# Patient Record
Sex: Female | Born: 1960 | Race: Black or African American | Hispanic: No | Marital: Married | Smoking: Never smoker
Health system: Southern US, Community
[De-identification: ages and names within clinical notes are randomized; demographics above are authoritative.]

## PROBLEM LIST (undated history)

## (undated) DIAGNOSIS — R200 Anesthesia of skin: Secondary | ICD-10-CM

## (undated) DIAGNOSIS — R202 Paresthesia of skin: Secondary | ICD-10-CM

## (undated) DIAGNOSIS — G4733 Obstructive sleep apnea (adult) (pediatric): Secondary | ICD-10-CM

## (undated) DIAGNOSIS — I10 Essential (primary) hypertension: Secondary | ICD-10-CM

## (undated) DIAGNOSIS — J45909 Unspecified asthma, uncomplicated: Secondary | ICD-10-CM

## (undated) DIAGNOSIS — K219 Gastro-esophageal reflux disease without esophagitis: Secondary | ICD-10-CM

## (undated) HISTORY — PX: COLONOSCOPY: SHX174

## (undated) HISTORY — PX: ROTATOR CUFF REPAIR: SHX139

---

## 1994-10-31 HISTORY — PX: KNEE ARTHROSCOPY: SUR90

## 1998-10-31 HISTORY — PX: LAPAROSCOPIC CHOLECYSTECTOMY: SUR755

## 2002-02-13 ENCOUNTER — Observation Stay (HOSPITAL_COMMUNITY): Admission: EM | Admit: 2002-02-13 | Discharge: 2002-02-13 | Payer: Self-pay | Admitting: Emergency Medicine

## 2002-02-13 ENCOUNTER — Encounter: Payer: Self-pay | Admitting: Emergency Medicine

## 2002-02-24 ENCOUNTER — Emergency Department (HOSPITAL_COMMUNITY): Admission: EM | Admit: 2002-02-24 | Discharge: 2002-02-24 | Payer: Self-pay | Admitting: *Deleted

## 2002-11-28 ENCOUNTER — Emergency Department (HOSPITAL_COMMUNITY): Admission: EM | Admit: 2002-11-28 | Discharge: 2002-11-28 | Payer: Self-pay | Admitting: Emergency Medicine

## 2002-11-28 ENCOUNTER — Encounter: Payer: Self-pay | Admitting: Emergency Medicine

## 2015-11-01 HISTORY — PX: ABDOMINAL HYSTERECTOMY: SHX81

## 2016-12-27 DIAGNOSIS — M7521 Bicipital tendinitis, right shoulder: Secondary | ICD-10-CM | POA: Insufficient documentation

## 2017-08-27 ENCOUNTER — Ambulatory Visit: Payer: Self-pay | Admitting: Orthopedic Surgery

## 2017-09-06 NOTE — Patient Instructions (Signed)
TAMBERLY POMPLUN  09/06/2017   Your procedure is scheduled on: Monday, Nov. 19, 2018   Report to [facility] Main  Entrance    Report to admitting at 11:15 AM   Call this number if you have problems the morning of surgery  [phone]   Remember: ONLY 1 PERSON MAY GO WITH YOU TO SHORT STAY TO GET  READY MORNING OF YOUR SURGERY.   Do not eat food or drink liquids :After Midnight.    Take these medicines the morning of surgery with A SIP OF WATER: Gabapentin                               You may not have any metal on your body including hair pins, jewelry, and piercings             Do not wear make-up, lotions, powders or perfumes, deodorant             Do not wear nail polish.  Do not shave  48 hours prior to surgery.              Do not bring valuables to the hospital. [facility] IS NOT             RESPONSIBLE   FOR VALUABLES.   Contacts, dentures or bridgework may not be worn into surgery.   Leave suitcase in the car. After surgery it may be brought to your room.              Please read over the following fact sheets you were given: _____________________________________________________________________             [facility] - Preparing for Surgery Before surgery, you can play an important role.  Because skin is not sterile, your skin needs to be as free of germs as possible.  You can reduce the number of germs on your skin by washing with CHG (chlorahexidine gluconate) soap before surgery.  CHG is an antiseptic cleaner which kills germs and bonds with the skin to continue killing germs even after washing. Please DO NOT use if you have an allergy to CHG or antibacterial soaps.  If your skin becomes reddened/irritated stop using the CHG and inform your nurse when you arrive at Short Stay. Do not shave (including legs and underarms) for at least 48 hours prior to the first CHG shower.  You may shave your face/neck.  Please follow these instructions  carefully:  1.  Shower with CHG Soap the night before surgery and the  morning of surgery.  2.  If you choose to wash your hair, wash your hair first as usual with your normal  shampoo.  3.  After you shampoo, rinse your hair and body thoroughly to remove the shampoo.                             4.  Use CHG as you would any other liquid soap.  You can apply chg directly to the skin and wash.  Gently with a scrungie or clean washcloth.  5.  Apply the CHG Soap to your body ONLY FROM THE NECK DOWN.   Do   not use on face/ open  Wound or open sores. Avoid contact with eyes, ears mouth and   genitals (private parts).                       Wash face,  Genitals (private parts) with your normal soap.             6.  Wash thoroughly, paying special attention to the area where your    surgery  will be performed.  7.  Thoroughly rinse your body with warm water from the neck down.  8.  DO NOT shower/wash with your normal soap after using and rinsing off the CHG Soap.                9.  Pat yourself dry with a clean towel.            10.  Wear clean pajamas.            11.  Place clean sheets on your bed the night of your first shower and do not  sleep with pets. Day of Surgery : Do not apply any lotions/deodorants the morning of surgery.  Please wear clean clothes to the hospital/surgery center.  FAILURE TO FOLLOW THESE INSTRUCTIONS MAY RESULT IN THE CANCELLATION OF YOUR SURGERY  PATIENT SIGNATURE_________________________________  NURSE SIGNATURE__________________________________  ________________________________________________________________________   Adam Phenix  An incentive spirometer is a tool that can help keep your lungs clear and active. This tool measures how well you are filling your lungs with each breath. Taking long deep breaths may help reverse or decrease the chance of developing breathing (pulmonary) problems (especially infection) following:  A  long period of time when you are unable to move or be active. BEFORE THE PROCEDURE   If the spirometer includes an indicator to show your best effort, your nurse or respiratory therapist will set it to a desired goal.  If possible, sit up straight or lean slightly forward. Try not to slouch.  Hold the incentive spirometer in an upright position. INSTRUCTIONS FOR USE  1. Sit on the edge of your bed if possible, or sit up as far as you can in bed or on a chair. 2. Hold the incentive spirometer in an upright position. 3. Breathe out normally. 4. Place the mouthpiece in your mouth and seal your lips tightly around it. 5. Breathe in slowly and as deeply as possible, raising the piston or the ball toward the top of the column. 6. Hold your breath for 3-5 seconds or for as long as possible. Allow the piston or ball to fall to the bottom of the column. 7. Remove the mouthpiece from your mouth and breathe out normally. 8. Rest for a few seconds and repeat Steps 1 through 7 at least 10 times every 1-2 hours when you are awake. Take your time and take a few normal breaths between deep breaths. 9. The spirometer may include an indicator to show your best effort. Use the indicator as a goal to work toward during each repetition. 10. After each set of 10 deep breaths, practice coughing to be sure your lungs are clear. If you have an incision (the cut made at the time of surgery), support your incision when coughing by placing a pillow or rolled up towels firmly against it. Once you are able to get out of bed, walk around indoors and cough well. You may stop using the incentive spirometer when instructed by your caregiver.  RISKS AND COMPLICATIONS  Take your time  so you do not get dizzy or light-headed.  If you are in pain, you may need to take or ask for pain medication before doing incentive spirometry. It is harder to take a deep breath if you are having pain. AFTER USE  Rest and breathe slowly and  easily.  It can be helpful to keep track of a log of your progress. Your caregiver can provide you with a simple table to help with this. If you are using the spirometer at home, follow these instructions: [place] IF:   You are having difficultly using the spirometer.  You have trouble using the spirometer as often as instructed.  Your pain medication is not giving enough relief while using the spirometer.  You develop fever of 100.5 F (38.1 C) or higher. SEEK IMMEDIATE MEDICAL CARE IF:   You cough up bloody sputum that had not been present before.  You develop fever of 102 F (38.9 C) or greater.  You develop worsening pain at or near the incision site. MAKE SURE YOU:   Understand these instructions.  Will watch your condition.  Will get help right away if you are not doing well or get worse. Document Released: 02/27/2007 Document Revised: 01/09/2012 Document Reviewed: 04/30/2007 ExitCare Patient Information 2014 [employer], [place].   ________________________________________________________________________  WHAT IS A BLOOD TRANSFUSION? Blood Transfusion Information  A transfusion is the replacement of blood or some of its parts. Blood is made up of multiple cells which provide different functions.  Red blood cells carry oxygen and are used for blood loss replacement.  White blood cells fight against infection.  Platelets control bleeding.  Plasma helps clot blood.  Other blood products are available for specialized needs, such as hemophilia or other clotting disorders. BEFORE THE TRANSFUSION  Who gives blood for transfusions?   Healthy volunteers who are fully evaluated to make sure their blood is safe. This is blood bank blood. Transfusion therapy is the safest it has ever been in the practice of medicine. Before blood is taken from a donor, a complete history is taken to make sure that person has no history of diseases nor engages in risky social  behavior (examples are intravenous drug use or sexual activity with multiple partners). The donor's travel history is screened to minimize risk of transmitting infections, such as malaria. The donated blood is tested for signs of infectious diseases, such as HIV and hepatitis. The blood is then tested to be sure it is compatible with you in order to minimize the chance of a transfusion reaction. If you or a relative donates blood, this is often done in anticipation of surgery and is not appropriate for emergency situations. It takes many days to process the donated blood. RISKS AND COMPLICATIONS Although transfusion therapy is very safe and saves many lives, the main dangers of transfusion include:   Getting an infectious disease.  Developing a transfusion reaction. This is an allergic reaction to something in the blood you were given. Every precaution is taken to prevent this. The decision to have a blood transfusion has been considered carefully by your caregiver before blood is given. Blood is not given unless the benefits outweigh the risks. AFTER THE TRANSFUSION  Right after receiving a blood transfusion, you will usually feel much better and more energetic. This is especially true if your red blood cells have gotten low (anemic). The transfusion raises the level of the red blood cells which carry oxygen, and this usually causes an energy increase.  The  nurse administering the transfusion will monitor you carefully for complications. HOME CARE INSTRUCTIONS  No special instructions are needed after a transfusion. You may find your energy is better. Speak with your caregiver about any limitations on activity for underlying diseases you may have. SEEK MEDICAL CARE IF:   Your condition is not improving after your transfusion.  You develop redness or irritation at the intravenous (IV) site. SEEK IMMEDIATE MEDICAL CARE IF:  Any of the following symptoms occur over the next 12 hours:  Shaking  chills.  You have a temperature by mouth above 102 F (38.9 C), not controlled by medicine.  Chest, back, or muscle pain.  People around you feel you are not acting correctly or are confused.  Shortness of breath or difficulty breathing.  Dizziness and fainting.  You get a rash or develop hives.  You have a decrease in urine output.  Your urine turns a dark color or changes to pink, red, or brown. Any of the following symptoms occur over the next 10 days:  You have a temperature by mouth above 102 F (38.9 C), not controlled by medicine.  Shortness of breath.  Weakness after normal activity.  The white part of the eye turns yellow (jaundice).  You have a decrease in the amount of urine or are urinating less often.  Your urine turns a dark color or changes to pink, red, or brown. Document Released: 10/14/2000 Document Revised: 01/09/2012 Document Reviewed: 06/02/2008 [facility] Patient Information 2014 [place], [place].  _______________________________________________________________________

## 2017-09-11 ENCOUNTER — Encounter (HOSPITAL_COMMUNITY)
Admission: RE | Admit: 2017-09-11 | Discharge: 2017-09-11 | Disposition: A | Discharge status: Home / Self Care | Payer: PRIVATE HEALTH INSURANCE | Source: Ambulatory Visit | Attending: Orthopedic Surgery | Admitting: Orthopedic Surgery

## 2017-09-11 ENCOUNTER — Encounter (HOSPITAL_COMMUNITY): Payer: Self-pay

## 2017-09-11 ENCOUNTER — Other Ambulatory Visit: Payer: Self-pay

## 2017-09-11 DIAGNOSIS — M1712 Unilateral primary osteoarthritis, left knee: Secondary | ICD-10-CM | POA: Diagnosis not present

## 2017-09-11 DIAGNOSIS — Z01818 Encounter for other preprocedural examination: Secondary | ICD-10-CM | POA: Insufficient documentation

## 2017-09-11 DIAGNOSIS — R9431 Abnormal electrocardiogram [ECG] [EKG]: Secondary | ICD-10-CM | POA: Insufficient documentation

## 2017-09-11 HISTORY — DX: Essential (primary) hypertension: I10

## 2017-09-11 HISTORY — DX: Gastro-esophageal reflux disease without esophagitis: K21.9

## 2017-09-11 LAB — COMPREHENSIVE METABOLIC PANEL
ALBUMIN: 4.1 g/dL (ref 3.5–5.0)
ALK PHOS: 57 U/L (ref 38–126)
ALT: 21 U/L (ref 14–54)
AST: 23 U/L (ref 15–41)
Anion gap: 5 (ref 5–15)
BILIRUBIN TOTAL: 0.9 mg/dL (ref 0.3–1.2)
BUN: 13 mg/dL (ref 6–20)
CALCIUM: 9.5 mg/dL (ref 8.9–10.3)
CO2: 30 mmol/L (ref 22–32)
Chloride: 106 mmol/L (ref 101–111)
Creatinine, Ser: 1.04 mg/dL — ABNORMAL HIGH (ref 0.44–1.00)
GFR calc Af Amer: >60 mL/min (ref >60)
GFR calc non Af Amer: 58 mL/min — ABNORMAL LOW (ref >60)
GLUCOSE: 120 mg/dL — AB (ref 65–99)
Potassium: 3.9 mmol/L (ref 3.5–5.1)
Sodium: 141 mmol/L (ref 135–145)
TOTAL PROTEIN: 7.6 g/dL (ref 6.5–8.1)

## 2017-09-11 LAB — SURGICAL PCR SCREEN
MRSA, PCR: NEGATIVE
STAPHYLOCOCCUS AUREUS: NEGATIVE

## 2017-09-11 LAB — CBC
HEMATOCRIT: 38.1 % (ref 36.0–46.0)
HEMOGLOBIN: 12.7 g/dL (ref 12.0–15.0)
MCH: 29.4 pg (ref 26.0–34.0)
MCHC: 33.3 g/dL (ref 30.0–36.0)
MCV: 88.2 fL (ref 78.0–100.0)
Platelets: 201 10*3/uL (ref 150–400)
RBC: 4.32 MIL/uL (ref 3.87–5.11)
RDW: 14.7 % (ref 11.5–15.5)
WBC: 4.7 10*3/uL (ref 4.0–10.5)

## 2017-09-11 LAB — PROTIME-INR
INR: 0.98
Prothrombin Time: 12.9 seconds (ref 11.4–15.2)

## 2017-09-11 LAB — ABO/RH: ABO/RH(D): O POS

## 2017-09-11 LAB — APTT: APTT: 34 s (ref 24–36)

## 2017-09-13 ENCOUNTER — Other Ambulatory Visit (HOSPITAL_COMMUNITY): Payer: Self-pay

## 2017-09-16 ENCOUNTER — Ambulatory Visit: Payer: Self-pay | Admitting: Orthopedic Surgery
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
## 2017-09-18 ENCOUNTER — Encounter (HOSPITAL_COMMUNITY): Payer: Self-pay | Admitting: *Deleted

## 2017-09-18 ENCOUNTER — Inpatient Hospital Stay (HOSPITAL_COMMUNITY)
Admission: RE | Admit: 2017-09-18 | Discharge: 2017-09-20 | DRG: 470 | Disposition: A | Discharge status: Home / Self Care | Payer: PRIVATE HEALTH INSURANCE | Source: Ambulatory Visit | Attending: Orthopedic Surgery | Admitting: Orthopedic Surgery

## 2017-09-18 ENCOUNTER — Other Ambulatory Visit: Payer: Self-pay

## 2017-09-18 ENCOUNTER — Inpatient Hospital Stay (HOSPITAL_COMMUNITY): Payer: PRIVATE HEALTH INSURANCE | Admitting: Registered Nurse

## 2017-09-18 ENCOUNTER — Encounter (HOSPITAL_COMMUNITY)
Admission: RE | Disposition: A | Discharge status: Home / Self Care | Payer: Self-pay | Source: Ambulatory Visit | Attending: Orthopedic Surgery

## 2017-09-18 DIAGNOSIS — H409 Unspecified glaucoma: Secondary | ICD-10-CM | POA: Diagnosis present

## 2017-09-18 DIAGNOSIS — K5790 Diverticulosis of intestine, part unspecified, without perforation or abscess without bleeding: Secondary | ICD-10-CM | POA: Diagnosis present

## 2017-09-18 DIAGNOSIS — M170 Bilateral primary osteoarthritis of knee: Secondary | ICD-10-CM | POA: Diagnosis present

## 2017-09-18 DIAGNOSIS — Z87891 Personal history of nicotine dependence: Secondary | ICD-10-CM | POA: Diagnosis not present

## 2017-09-18 DIAGNOSIS — Z8249 Family history of ischemic heart disease and other diseases of the circulatory system: Secondary | ICD-10-CM | POA: Diagnosis not present

## 2017-09-18 DIAGNOSIS — M069 Rheumatoid arthritis, unspecified: Secondary | ICD-10-CM | POA: Diagnosis present

## 2017-09-18 DIAGNOSIS — Z8261 Family history of arthritis: Secondary | ICD-10-CM

## 2017-09-18 DIAGNOSIS — J45909 Unspecified asthma, uncomplicated: Secondary | ICD-10-CM | POA: Diagnosis present

## 2017-09-18 DIAGNOSIS — Z885 Allergy status to narcotic agent status: Secondary | ICD-10-CM

## 2017-09-18 DIAGNOSIS — G4730 Sleep apnea, unspecified: Secondary | ICD-10-CM | POA: Diagnosis present

## 2017-09-18 DIAGNOSIS — Z91040 Latex allergy status: Secondary | ICD-10-CM

## 2017-09-18 DIAGNOSIS — M25562 Pain in left knee: Secondary | ICD-10-CM | POA: Diagnosis present

## 2017-09-18 DIAGNOSIS — I10 Essential (primary) hypertension: Secondary | ICD-10-CM | POA: Diagnosis present

## 2017-09-18 DIAGNOSIS — M1710 Unilateral primary osteoarthritis, unspecified knee: Secondary | ICD-10-CM

## 2017-09-18 DIAGNOSIS — K219 Gastro-esophageal reflux disease without esophagitis: Secondary | ICD-10-CM | POA: Diagnosis present

## 2017-09-18 DIAGNOSIS — M179 Osteoarthritis of knee, unspecified: Secondary | ICD-10-CM | POA: Diagnosis present

## 2017-09-18 DIAGNOSIS — M79609 Pain in unspecified limb: Secondary | ICD-10-CM | POA: Diagnosis not present

## 2017-09-18 HISTORY — PX: TOTAL KNEE ARTHROPLASTY: SHX125

## 2017-09-18 LAB — TYPE AND SCREEN
ABO/RH(D): O POS
ANTIBODY SCREEN: NEGATIVE

## 2017-09-18 SURGERY — ARTHROPLASTY, KNEE, TOTAL
Anesthesia: Spinal | Site: Knee | Laterality: Left

## 2017-09-18 MED ORDER — MENTHOL 3 MG MT LOZG: 1.0000 | LOZENGE | OROMUCOSAL | Status: DC | PRN

## 2017-09-18 MED ORDER — PROPOFOL 10 MG/ML IV BOLUS
INTRAVENOUS | Status: AC
  Filled 2017-09-18: qty 20

## 2017-09-18 MED ORDER — MIDAZOLAM HCL 2 MG/2ML IJ SOLN
INTRAMUSCULAR | Status: AC
  Filled 2017-09-18: qty 2

## 2017-09-18 MED ORDER — HYDROMORPHONE HCL 2 MG PO TABS
2.0000 mg | ORAL_TABLET | ORAL | Status: DC | PRN
  Administered 2017-09-18: 2 mg via ORAL
  Filled 2017-09-18: qty 1

## 2017-09-18 MED ORDER — LACTATED RINGERS IV SOLN
INTRAVENOUS | Status: DC
  Administered 2017-09-18: 12:00:00 via INTRAVENOUS

## 2017-09-18 MED ORDER — FENTANYL CITRATE (PF) 100 MCG/2ML IJ SOLN
INTRAMUSCULAR | Status: AC
  Filled 2017-09-18: qty 2

## 2017-09-18 MED ORDER — METOCLOPRAMIDE HCL 5 MG PO TABS: 5.0000 mg | ORAL_TABLET | Freq: Three times a day (TID) | ORAL | Status: DC | PRN

## 2017-09-18 MED ORDER — EPHEDRINE 5 MG/ML INJ
INTRAVENOUS | Status: AC
  Filled 2017-09-18: qty 10

## 2017-09-18 MED ORDER — ACETAMINOPHEN 10 MG/ML IV SOLN
1000.0000 mg | Freq: Once | INTRAVENOUS | Status: AC
  Administered 2017-09-18: 1000 mg via INTRAVENOUS

## 2017-09-18 MED ORDER — LIDOCAINE 2% (20 MG/ML) 5 ML SYRINGE
INTRAMUSCULAR | Status: DC | PRN
  Administered 2017-09-18: 100 mg via INTRAVENOUS

## 2017-09-18 MED ORDER — METOCLOPRAMIDE HCL 5 MG/ML IJ SOLN
5.0000 mg | Freq: Three times a day (TID) | INTRAMUSCULAR | Status: DC | PRN
  Administered 2017-09-19: 13:00:00 10 mg via INTRAVENOUS
  Filled 2017-09-18: qty 2

## 2017-09-18 MED ORDER — SODIUM CHLORIDE 0.9 % IR SOLN
Status: DC | PRN
  Administered 2017-09-18: 1000 mL

## 2017-09-18 MED ORDER — SODIUM CHLORIDE 0.9 % IV SOLN
1000.0000 mg | Freq: Once | INTRAVENOUS | Status: AC
  Administered 2017-09-18: 1000 mg via INTRAVENOUS
  Filled 2017-09-18: qty 1100

## 2017-09-18 MED ORDER — FLEET ENEMA 7-19 GM/118ML RE ENEM: 1.0000 | ENEMA | Freq: Once | RECTAL | Status: DC | PRN

## 2017-09-18 MED ORDER — ACETAMINOPHEN 500 MG PO TABS
1000.0000 mg | ORAL_TABLET | Freq: Four times a day (QID) | ORAL | Status: AC
  Administered 2017-09-18 – 2017-09-20 (×4): 1000 mg via ORAL
  Filled 2017-09-18 (×4): qty 2

## 2017-09-18 MED ORDER — BUPIVACAINE LIPOSOME 1.3 % IJ SUSP
20.0000 mL | Freq: Once | INTRAMUSCULAR | Status: DC
  Filled 2017-09-18: qty 20

## 2017-09-18 MED ORDER — PROPOFOL 10 MG/ML IV BOLUS
INTRAVENOUS | Status: AC
  Filled 2017-09-18: qty 40

## 2017-09-18 MED ORDER — MIDAZOLAM HCL 2 MG/2ML IJ SOLN
1.0000 mg | INTRAMUSCULAR | Status: DC | PRN
  Administered 2017-09-18: 2 mg via INTRAVENOUS

## 2017-09-18 MED ORDER — STERILE WATER FOR IRRIGATION IR SOLN
Status: DC | PRN
  Administered 2017-09-18: 2000 mL

## 2017-09-18 MED ORDER — TRAMADOL HCL 50 MG PO TABS: 50.0000 mg | ORAL_TABLET | Freq: Four times a day (QID) | ORAL | Status: DC | PRN

## 2017-09-18 MED ORDER — TRANEXAMIC ACID 1000 MG/10ML IV SOLN
1000.0000 mg | INTRAVENOUS | Status: AC
  Administered 2017-09-18: 1000 mg via INTRAVENOUS
  Filled 2017-09-18: qty 1100

## 2017-09-18 MED ORDER — METHOCARBAMOL 1000 MG/10ML IJ SOLN
500.0000 mg | Freq: Four times a day (QID) | INTRAMUSCULAR | Status: DC | PRN
  Administered 2017-09-18: 500 mg via INTRAVENOUS
  Filled 2017-09-18: qty 550

## 2017-09-18 MED ORDER — DEXAMETHASONE SODIUM PHOSPHATE 10 MG/ML IJ SOLN
INTRAMUSCULAR | Status: AC
  Filled 2017-09-18: qty 1

## 2017-09-18 MED ORDER — POLYETHYLENE GLYCOL 3350 17 G PO PACK: 17.0000 g | PACK | Freq: Every day | ORAL | Status: DC | PRN

## 2017-09-18 MED ORDER — CEFAZOLIN SODIUM-DEXTROSE 2-4 GM/100ML-% IV SOLN
2.0000 g | Freq: Four times a day (QID) | INTRAVENOUS | Status: AC
  Administered 2017-09-18 – 2017-09-19 (×2): 2 g via INTRAVENOUS
  Filled 2017-09-18 (×2): qty 100

## 2017-09-18 MED ORDER — GABAPENTIN 300 MG PO CAPS: 300.0000 mg | ORAL_CAPSULE | Freq: Once | ORAL | Status: DC

## 2017-09-18 MED ORDER — BUPIVACAINE LIPOSOME 1.3 % IJ SUSP
INTRAMUSCULAR | Status: DC | PRN
  Administered 2017-09-18: 20 mL

## 2017-09-18 MED ORDER — ACETAMINOPHEN 10 MG/ML IV SOLN
INTRAVENOUS | Status: AC
  Filled 2017-09-18: qty 100

## 2017-09-18 MED ORDER — SODIUM CHLORIDE 0.9 % IJ SOLN
INTRAMUSCULAR | Status: AC
  Filled 2017-09-18: qty 10

## 2017-09-18 MED ORDER — CEFAZOLIN SODIUM-DEXTROSE 2-4 GM/100ML-% IV SOLN
INTRAVENOUS | Status: AC
  Filled 2017-09-18: qty 100

## 2017-09-18 MED ORDER — FUROSEMIDE 20 MG PO TABS: 20.0000 mg | ORAL_TABLET | Freq: Every day | ORAL | Status: DC | PRN

## 2017-09-18 MED ORDER — ROPIVACAINE HCL 7.5 MG/ML IJ SOLN
INTRAMUSCULAR | Status: DC | PRN
  Administered 2017-09-18: 20 mL via PERINEURAL

## 2017-09-18 MED ORDER — METHOCARBAMOL 500 MG PO TABS
500.0000 mg | ORAL_TABLET | Freq: Four times a day (QID) | ORAL | Status: DC | PRN
  Administered 2017-09-18 – 2017-09-20 (×5): 500 mg via ORAL
  Filled 2017-09-18 (×6): qty 1

## 2017-09-18 MED ORDER — BISACODYL 10 MG RE SUPP: 10.0000 mg | Freq: Every day | RECTAL | Status: DC | PRN

## 2017-09-18 MED ORDER — GABAPENTIN 100 MG PO CAPS: 100.0000 mg | ORAL_CAPSULE | Freq: Two times a day (BID) | ORAL | Status: DC | PRN

## 2017-09-18 MED ORDER — ONDANSETRON HCL 4 MG/2ML IJ SOLN
INTRAMUSCULAR | Status: DC | PRN
  Administered 2017-09-18: 4 mg via INTRAVENOUS

## 2017-09-18 MED ORDER — LIDOCAINE 2% (20 MG/ML) 5 ML SYRINGE
INTRAMUSCULAR | Status: AC
  Filled 2017-09-18: qty 5

## 2017-09-18 MED ORDER — DEXAMETHASONE SODIUM PHOSPHATE 10 MG/ML IJ SOLN
10.0000 mg | Freq: Once | INTRAMUSCULAR | Status: AC
  Administered 2017-09-18: 10 mg via INTRAVENOUS

## 2017-09-18 MED ORDER — DOCUSATE SODIUM 100 MG PO CAPS
100.0000 mg | ORAL_CAPSULE | Freq: Two times a day (BID) | ORAL | Status: DC
  Administered 2017-09-18 – 2017-09-20 (×4): 100 mg via ORAL
  Filled 2017-09-18 (×4): qty 1

## 2017-09-18 MED ORDER — PROPOFOL 500 MG/50ML IV EMUL
INTRAVENOUS | Status: DC | PRN
  Administered 2017-09-18: 40 ug/kg/min via INTRAVENOUS

## 2017-09-18 MED ORDER — EPHEDRINE SULFATE-NACL 50-0.9 MG/10ML-% IV SOSY
PREFILLED_SYRINGE | INTRAVENOUS | Status: DC | PRN
  Administered 2017-09-18 (×2): 5 mg via INTRAVENOUS

## 2017-09-18 MED ORDER — PHENOL 1.4 % MT LIQD
1.0000 | OROMUCOSAL | Status: DC | PRN
  Filled 2017-09-18: qty 177

## 2017-09-18 MED ORDER — FENTANYL CITRATE (PF) 100 MCG/2ML IJ SOLN
50.0000 ug | INTRAMUSCULAR | Status: AC | PRN
Start: 2017-09-18 — End: 2017-09-18
  Administered 2017-09-18: 50 ug via INTRAVENOUS
  Administered 2017-09-18: 100 ug via INTRAVENOUS
  Administered 2017-09-18: 25 ug via INTRAVENOUS

## 2017-09-18 MED ORDER — ACETAMINOPHEN 650 MG RE SUPP: 650.0000 mg | RECTAL | Status: DC | PRN

## 2017-09-18 MED ORDER — HYDROMORPHONE HCL 1 MG/ML IJ SOLN
0.5000 mg | INTRAMUSCULAR | Status: DC | PRN
  Administered 2017-09-18 – 2017-09-19 (×2): 0.5 mg via INTRAVENOUS
  Filled 2017-09-18 (×2): qty 0.5

## 2017-09-18 MED ORDER — CEFAZOLIN SODIUM-DEXTROSE 2-4 GM/100ML-% IV SOLN
2.0000 g | INTRAVENOUS | Status: AC
  Administered 2017-09-18: 2 g via INTRAVENOUS

## 2017-09-18 MED ORDER — HYDROMORPHONE HCL 2 MG PO TABS
4.0000 mg | ORAL_TABLET | ORAL | Status: DC | PRN
  Administered 2017-09-19 (×2): 4 mg via ORAL
  Filled 2017-09-18 (×2): qty 2

## 2017-09-18 MED ORDER — SODIUM CHLORIDE 0.9 % IV SOLN
INTRAVENOUS | Status: DC
  Administered 2017-09-18: 18:00:00 via INTRAVENOUS

## 2017-09-18 MED ORDER — CHLORHEXIDINE GLUCONATE 4 % EX LIQD: 60.0000 mL | Freq: Once | CUTANEOUS | Status: DC

## 2017-09-18 MED ORDER — ONDANSETRON HCL 4 MG/2ML IJ SOLN
4.0000 mg | Freq: Four times a day (QID) | INTRAMUSCULAR | Status: DC | PRN
  Administered 2017-09-19: 10:00:00 4 mg via INTRAVENOUS
  Filled 2017-09-18: qty 2

## 2017-09-18 MED ORDER — GABAPENTIN 300 MG PO CAPS
ORAL_CAPSULE | ORAL | Status: AC
  Filled 2017-09-18: qty 1

## 2017-09-18 MED ORDER — ACETAMINOPHEN 325 MG PO TABS: 650.0000 mg | ORAL_TABLET | ORAL | Status: DC | PRN

## 2017-09-18 MED ORDER — SODIUM CHLORIDE 0.9 % IJ SOLN
INTRAMUSCULAR | Status: DC | PRN
  Administered 2017-09-18: 60 mL

## 2017-09-18 MED ORDER — RIVAROXABAN 10 MG PO TABS
10.0000 mg | ORAL_TABLET | Freq: Every day | ORAL | Status: DC
  Administered 2017-09-19 – 2017-09-20 (×2): 10 mg via ORAL
  Filled 2017-09-18 (×2): qty 1

## 2017-09-18 MED ORDER — ONDANSETRON HCL 4 MG PO TABS: 4.0000 mg | ORAL_TABLET | Freq: Four times a day (QID) | ORAL | Status: DC | PRN

## 2017-09-18 MED ORDER — ONDANSETRON HCL 4 MG/2ML IJ SOLN
INTRAMUSCULAR | Status: AC
  Filled 2017-09-18: qty 2

## 2017-09-18 MED ORDER — BUPIVACAINE IN DEXTROSE 0.75-8.25 % IT SOLN
INTRATHECAL | Status: DC | PRN
  Administered 2017-09-18: 2 mL via INTRATHECAL

## 2017-09-18 MED ORDER — SODIUM CHLORIDE 0.9 % IJ SOLN
INTRAMUSCULAR | Status: AC
  Filled 2017-09-18: qty 50

## 2017-09-18 MED ORDER — DIPHENHYDRAMINE HCL 12.5 MG/5ML PO ELIX: 12.5000 mg | ORAL_SOLUTION | ORAL | Status: DC | PRN

## 2017-09-18 MED ORDER — DEXAMETHASONE SODIUM PHOSPHATE 10 MG/ML IJ SOLN
10.0000 mg | Freq: Once | INTRAMUSCULAR | Status: AC
  Administered 2017-09-19: 09:00:00 10 mg via INTRAVENOUS
  Filled 2017-09-18: qty 1

## 2017-09-18 SURGICAL SUPPLY — 49 items
BAG DECANTER FOR FLEXI CONT (MISCELLANEOUS) IMPLANT
BAG ZIPLOCK 12X15 (MISCELLANEOUS) ×3 IMPLANT
BANDAGE ACE 6X5 VEL STRL LF (GAUZE/BANDAGES/DRESSINGS) ×3 IMPLANT
BLADE SAG 18X100X1.27 (BLADE) ×3 IMPLANT
BLADE SAW SGTL 11.0X1.19X90.0M (BLADE) ×3 IMPLANT
BOWL SMART MIX CTS (DISPOSABLE) ×3 IMPLANT
CAPT KNEE TOTAL 3 ATTUNE ×3 IMPLANT
CEMENT HV SMART SET (Cement) ×6 IMPLANT
CLOSURE WOUND 1/2 X4 (GAUZE/BANDAGES/DRESSINGS) ×1
COVER SURGICAL LIGHT HANDLE (MISCELLANEOUS) ×3 IMPLANT
CUFF TOURN SGL QUICK 34 (TOURNIQUET CUFF) ×2
CUFF TRNQT CYL 34X4X40X1 (TOURNIQUET CUFF) ×1 IMPLANT
DECANTER SPIKE VIAL GLASS SM (MISCELLANEOUS) ×3 IMPLANT
DRAPE U-SHAPE 47X51 STRL (DRAPES) ×3 IMPLANT
DRSG ADAPTIC 3X8 NADH LF (GAUZE/BANDAGES/DRESSINGS) ×3 IMPLANT
DRSG PAD ABDOMINAL 8X10 ST (GAUZE/BANDAGES/DRESSINGS) ×3 IMPLANT
DURAPREP 26ML APPLICATOR (WOUND CARE) ×3 IMPLANT
ELECT REM PT RETURN 15FT ADLT (MISCELLANEOUS) ×3 IMPLANT
EVACUATOR 1/8 PVC DRAIN (DRAIN) ×3 IMPLANT
GAUZE SPONGE 4X4 12PLY STRL (GAUZE/BANDAGES/DRESSINGS) ×3 IMPLANT
GLOVE BIOGEL PI IND STRL 6.5 (GLOVE) ×2 IMPLANT
GLOVE BIOGEL PI IND STRL 7.5 (GLOVE) ×5 IMPLANT
GLOVE BIOGEL PI IND STRL 8 (GLOVE) ×3 IMPLANT
GLOVE BIOGEL PI INDICATOR 6.5 (GLOVE) ×4
GLOVE BIOGEL PI INDICATOR 7.5 (GLOVE) ×10
GLOVE BIOGEL PI INDICATOR 8 (GLOVE) ×6
GLOVE SURG SS PI 6.5 STRL IVOR (GLOVE) IMPLANT
GOWN STRL REUS W/TWL LRG LVL3 (GOWN DISPOSABLE) ×3 IMPLANT
GOWN STRL REUS W/TWL XL LVL3 (GOWN DISPOSABLE) IMPLANT
HANDPIECE INTERPULSE COAX TIP (DISPOSABLE) ×2
IMMOBILIZER KNEE 20 (SOFTGOODS) ×3
IMMOBILIZER KNEE 20 THIGH 36 (SOFTGOODS) ×1 IMPLANT
MANIFOLD NEPTUNE II (INSTRUMENTS) ×3 IMPLANT
NS IRRIG 1000ML POUR BTL (IV SOLUTION) ×3 IMPLANT
PACK TOTAL KNEE CUSTOM (KITS) ×3 IMPLANT
PADDING CAST COTTON 6X4 STRL (CAST SUPPLIES) ×3 IMPLANT
POSITIONER SURGICAL ARM (MISCELLANEOUS) ×3 IMPLANT
SET HNDPC FAN SPRY TIP SCT (DISPOSABLE) ×1 IMPLANT
STRIP CLOSURE SKIN 1/2X4 (GAUZE/BANDAGES/DRESSINGS) ×2 IMPLANT
SUT MNCRL AB 4-0 PS2 18 (SUTURE) ×3 IMPLANT
SUT STRATAFIX 0 PDS 27 VIOLET (SUTURE) ×3
SUT VIC AB 2-0 CT1 27 (SUTURE) ×6
SUT VIC AB 2-0 CT1 TAPERPNT 27 (SUTURE) ×3 IMPLANT
SUTURE STRATFX 0 PDS 27 VIOLET (SUTURE) ×1 IMPLANT
SYR 30ML LL (SYRINGE) ×6 IMPLANT
TRAY FOLEY BAG SILVER LF 16FR (CATHETERS) ×3 IMPLANT
WATER STERILE IRR 1000ML POUR (IV SOLUTION) ×6 IMPLANT
WRAP KNEE MAXI GEL POST OP (GAUZE/BANDAGES/DRESSINGS) ×3 IMPLANT
YANKAUER SUCT BULB TIP 10FT TU (MISCELLANEOUS) ×3 IMPLANT

## 2017-09-18 NOTE — Anesthesia Procedure Notes (Signed)
Spinal  Start time: 09/18/2017 1:20 PM End time: 09/18/2017 1:28 PM Staffing Resident/CRNA: Victoriano Lain, CRNA Preanesthetic Checklist Completed: patient identified, site marked, surgical consent, pre-op evaluation, timeout performed, IV checked, risks and benefits discussed and monitors and equipment checked Spinal Block Patient position: sitting Prep: DuraPrep Patient monitoring: heart rate, continuous pulse ox and blood pressure Approach: right paramedian Location: L3-4 Needle Needle type: Pencan  Needle gauge: 24 G Needle length: 10 cm Assessment Sensory level: T4 Additional Notes Spinal kit expiration date checked and verified. Two attempts by CRNA. - heme, +CSF. Pt tolerated well.

## 2017-09-18 NOTE — Transfer of Care (Signed)
Immediate Anesthesia Transfer of Care Note  Patient: Cynthia Dickson  Procedure(s) Performed: LEFT TOTAL KNEE ARTHROPLASTY (Left Knee)  Patient Location: PACU  Anesthesia Type:Spinal  Level of Consciousness: awake, alert , oriented and patient cooperative  Airway & Oxygen Therapy: Patient Spontanous Breathing and Patient connected to face mask oxygen  Post-op Assessment: Report given to RN and Post -op Vital signs reviewed and stable  Post vital signs: Reviewed and stable  Last Vitals:  Vitals:   09/18/17 1254 09/18/17 1300  BP:    Pulse: 67 64  Resp: 15 11  Temp:    SpO2: 100% 100%    Last Pain:  Vitals:   09/18/17 1146  TempSrc: Oral  PainSc:       Patients Stated Pain Goal: 5 (09/18/17 1142)  Complications: No apparent anesthesia complications

## 2017-09-18 NOTE — Anesthesia Procedure Notes (Signed)
Anesthesia Regional Block: Adductor canal block   Pre-Anesthetic Checklist: ,, timeout performed, Correct Patient, Correct Site, Correct Laterality, Correct Procedure, Correct Position, site marked, Risks and benefits discussed, pre-op evaluation,  At surgeon's request and post-op pain management  Laterality: Left  Prep: chloraprep       Needles:   Needle Type: Echogenic Needle     Needle Length: 10cm  Needle Gauge: 21     Additional Needles:   Procedures:,,,, ultrasound used (permanent image in chart),,,,  Narrative:  Start time: 09/18/2017 12:55 PM End time: 09/18/2017 1:04 PM Injection made incrementally with aspirations every 5 mL. Anesthesiologist: Cristela Blue, MD

## 2017-09-18 NOTE — Anesthesia Preprocedure Evaluation (Signed)
Anesthesia Evaluation  Patient identified by MRN, date of birth, ID band Patient awake    Reviewed: Allergy & Precautions, H&P , Patient's Chart, lab work & pertinent test results, reviewed documented beta blocker date and time   Airway Mallampati: II  TM Distance: >3 FB Neck ROM: full    Dental no notable dental hx.    Pulmonary asthma , sleep apnea ,    Pulmonary exam normal breath sounds clear to auscultation       Cardiovascular hypertension, On Medications  Rhythm:regular Rate:Normal     Neuro/Psych    GI/Hepatic GERD  ,  Endo/Other    Renal/GU      Musculoskeletal   Abdominal   Peds  Hematology   Anesthesia Other Findings   Reproductive/Obstetrics                             Anesthesia Physical Anesthesia Plan  ASA: III  Anesthesia Plan: Spinal   Post-op Pain Management:  Regional for Post-op pain   Induction:   PONV Risk Score and Plan: 2 and Dexamethasone, Ondansetron and Treatment may vary due to age or medical condition  Airway Management Planned:   Additional Equipment:   Intra-op Plan:   Post-operative Plan:   Informed Consent: I have reviewed the patients History and Physical, chart, labs and discussed the procedure including the risks, benefits and alternatives for the proposed anesthesia with the patient or authorized representative who has indicated his/her understanding and acceptance.   Dental Advisory Given  Plan Discussed with: CRNA and Surgeon  Anesthesia Plan Comments: (  )        Anesthesia Quick Evaluation

## 2017-09-18 NOTE — Anesthesia Postprocedure Evaluation (Signed)
Anesthesia Post Note  Patient: Cynthia Dickson  Procedure(s) Performed: LEFT TOTAL KNEE ARTHROPLASTY (Left Knee)     Patient location during evaluation: PACU Anesthesia Type: Spinal Level of consciousness: awake Pain management: satisfactory to patient Vital Signs Assessment: post-procedure vital signs reviewed and stable Respiratory status: spontaneous breathing Cardiovascular status: blood pressure returned to baseline Postop Assessment: no headache and spinal receding Anesthetic complications: no    Last Vitals:  Vitals:   09/18/17 1600 09/18/17 1615  BP: (!) 141/91 (!) 143/78  Pulse: (!) 55 (!) 56  Resp: 16 12  Temp:    SpO2: 100% 100%    Last Pain:  Vitals:   09/18/17 1615  TempSrc:   PainSc: 0-No pain                 Mahari Strahm EDWARD

## 2017-09-18 NOTE — Progress Notes (Signed)
AssistedDr. Kyle Jackson with left, ultrasound guided, adductor canal block. Side rails up, monitors on throughout procedure. See vital signs in flow sheet. Tolerated Procedure well.  

## 2017-09-18 NOTE — Interval H&P Note (Signed)
History and Physical Interval Note:  09/18/2017 11:31 AM  Cynthia Dickson  has presented today for surgery, with the diagnosis of Osteoarthritis Left knee  The various methods of treatment have been discussed with the patient and family. After consideration of risks, benefits and other options for treatment, the patient has consented to  Procedure(s): LEFT TOTAL KNEE ARTHROPLASTY (Left) as a surgical intervention .  The patient's history has been reviewed, patient examined, no change in status, stable for surgery.  I have reviewed the patient's chart and labs.  Questions were answered to the patient's satisfaction.     Homero Fellers Matheau [place]

## 2017-09-18 NOTE — Op Note (Signed)
OPERATIVE REPORT-TOTAL KNEE ARTHROPLASTY   Pre-operative diagnosis- Osteoarthritis  Left knee(s)  Post-operative diagnosis- Osteoarthritis Left knee(s)  Procedure-  Left  Total Knee Arthroplasty (Depuy Attune)  Surgeon- Gus Rankin. Kalecia Hartney, MD  Assistant- Avel Peace, PA-C   Anesthesia-  Adductor canal lock and spinal  EBL- 25 ml   Drains Hemovac  Tourniquet time-  Total Tourniquet Time Documented: Thigh (Left) - 43 minutes Total: Thigh (Left) - 43 minutes     Complications- None  Condition-PACU - hemodynamically stable.   Brief Clinical Note  Cynthia Dickson is a 56 y.o. year old female with end stage OA of her left knee with progressively worsening pain and dysfunction. She has constant pain, with activity and at rest and significant functional deficits with difficulties even with ADLs. She has had extensive non-op management including analgesics, injections of cortisone, and home exercise program, but remains in significant pain with significant dysfunction. Radiographs show bone on bone arthritis medial and patellofemoral. She presents now for left Total Knee Arthroplasty.    Procedure in detail---   The patient is brought into the operating room and positioned supine on the operating table. After successful administration of  Adductor canal lock and spinal,   a tourniquet is placed high on the  Left thigh(s) and the lower extremity is prepped and draped in the usual sterile fashion. Time out is performed by the operating team and then the  Left lower extremity is wrapped in Esmarch, knee flexed and the tourniquet inflated to 300 mmHg.       A midline incision is made with a ten blade through the subcutaneous tissue to the level of the extensor mechanism. A fresh blade is used to make a medial parapatellar arthrotomy. Soft tissue over the proximal medial tibia is subperiosteally elevated to the joint line with a knife and into the semimembranosus bursa with a Cobb elevator.  Soft tissue over the proximal lateral tibia is elevated with attention being paid to avoiding the patellar tendon on the tibial tubercle. The patella is everted, knee flexed 90 degrees and the ACL and PCL are removed. Findings are bone on bone medial and patellofemoral with large global osteophytes.        The drill is used to create a starting hole in the distal femur and the canal is thoroughly irrigated with sterile saline to remove the fatty contents. The 5 degree Left  valgus alignment guide is placed into the femoral canal and the distal femoral cutting block is pinned to remove 9 mm off the distal femur. Resection is made with an oscillating saw.      The tibia is subluxed forward and the menisci are removed. The extramedullary alignment guide is placed referencing proximally at the medial aspect of the tibial tubercle and distally along the second metatarsal axis and tibial crest. The block is pinned to remove 90mm off the more deficient medial  side. Resection is made with an oscillating saw. Size 6is the most appropriate size for the tibia and the proximal tibia is prepared with the modular drill and keel punch for that size.      The femoral sizing guide is placed and size 7 is most appropriate. Rotation is marked off the epicondylar axis and confirmed by creating a rectangular flexion gap at 90 degrees. The size 7 cutting block is pinned in this rotation and the anterior, posterior and chamfer cuts are made with the oscillating saw. The intercondylar block is then placed and that cut is made.  Trial size 6 tibial component, trial size 7 posterior stabilized femur and a 6  mm posterior stabilized rotating platform insert trial is placed. Full extension is achieved with excellent varus/valgus and anterior/posterior balance throughout full range of motion. The patella is everted and thickness measured to be 22  mm. Free hand resection is taken to 12 mm, a 35 template is placed, lug holes are drilled,  trial patella is placed, and it tracks normally. Osteophytes are removed off the posterior femur with the trial in place. All trials are removed and the cut bone surfaces prepared with pulsatile lavage. Cement is mixed and once ready for implantation, the size 6 tibial implant, size  7 posterior stabilized femoral component, and the size 35 patella are cemented in place and the patella is held with the clamp. The trial insert is placed and the knee held in full extension. The Exparel (20 ml mixed with 60 ml saline) is injected into the extensor mechanism, posterior capsule, medial and lateral gutters and subcutaneous tissues.  All extruded cement is removed and once the cement is hard the permanent 6 mm posterior stabilized rotating platform insert is placed into the tibial tray.      The wound is copiously irrigated with saline solution and the extensor mechanism closed over a hemovac drain with #1 V-loc suture. The tourniquet is released for a total tourniquet time of 43  minutes. Flexion against gravity is 130 degrees and the patella tracks normally. Subcutaneous tissue is closed with 2.0 vicryl and subcuticular with running 4.0 Monocryl. The incision is cleaned and dried and steri-strips and a bulky sterile dressing are applied. The limb is placed into a knee immobilizer and the patient is awakened and transported to recovery in stable condition.      Please note that a surgical assistant was a medical necessity for this procedure in order to perform it in a safe and expeditious manner. Surgical assistant was necessary to retract the ligaments and vital neurovascular structures to prevent injury to them and also necessary for proper positioning of the limb to allow for anatomic placement of the prosthesis.   Gus Rankin Cynthia Oates, MD    09/18/2017, 2:44 PM

## 2017-09-18 NOTE — Anesthesia Procedure Notes (Signed)
Spinal  Patient location during procedure: OR Staffing Anesthesiologist: Ivania Teagarden, MD Spinal Block Patient position: sitting Prep: DuraPrep Patient monitoring: heart rate, blood pressure and continuous pulse ox Approach: right paramedian Location: L3-4 Injection technique: single-shot Needle Needle type: Sprotte  Needle gauge: 24 G Needle length: 9 cm Assessment Sensory level: T4 Additional Notes Spinal Dosage in OR  .75% Bupivicaine ml       1.9 LLD x 3 min     

## 2017-09-19 LAB — CBC
HEMATOCRIT: 36.7 % (ref 36.0–46.0)
HEMOGLOBIN: 12.2 g/dL (ref 12.0–15.0)
MCH: 29.3 pg (ref 26.0–34.0)
MCHC: 33.2 g/dL (ref 30.0–36.0)
MCV: 88.2 fL (ref 78.0–100.0)
Platelets: 205 10*3/uL (ref 150–400)
RBC: 4.16 MIL/uL (ref 3.87–5.11)
RDW: 14.5 % (ref 11.5–15.5)
WBC: 10.8 10*3/uL — ABNORMAL HIGH (ref 4.0–10.5)

## 2017-09-19 LAB — BASIC METABOLIC PANEL
Anion gap: 9 (ref 5–15)
BUN: 12 mg/dL (ref 6–20)
CALCIUM: 8.8 mg/dL — AB (ref 8.9–10.3)
CHLORIDE: 105 mmol/L (ref 101–111)
CO2: 25 mmol/L (ref 22–32)
CREATININE: 0.9 mg/dL (ref 0.44–1.00)
GFR calc Af Amer: >60 mL/min (ref >60)
GFR calc non Af Amer: >60 mL/min (ref >60)
GLUCOSE: 142 mg/dL — AB (ref 65–99)
Potassium: 4 mmol/L (ref 3.5–5.1)
Sodium: 139 mmol/L (ref 135–145)

## 2017-09-19 MED ORDER — METHOCARBAMOL 500 MG PO TABS: 500.0000 mg | ORAL_TABLET | Freq: Four times a day (QID) | ORAL | 0 refills | Status: DC | PRN

## 2017-09-19 MED ORDER — ALUM & MAG HYDROXIDE-SIMETH 200-200-20 MG/5ML PO SUSP: 30.0000 mL | ORAL | Status: DC | PRN

## 2017-09-19 MED ORDER — RIVAROXABAN 10 MG PO TABS: 10.0000 mg | ORAL_TABLET | Freq: Every day | ORAL | 0 refills | Status: DC

## 2017-09-19 MED ORDER — TRAMADOL HCL 50 MG PO TABS: 50.0000 mg | ORAL_TABLET | Freq: Four times a day (QID) | ORAL | Status: DC | PRN

## 2017-09-19 MED ORDER — HYDROMORPHONE HCL 2 MG PO TABS
2.0000 mg | ORAL_TABLET | ORAL | Status: DC | PRN
  Administered 2017-09-19 – 2017-09-20 (×3): 4 mg via ORAL
  Filled 2017-09-19 (×5): qty 2
  Filled 2017-09-19: qty 1

## 2017-09-19 MED ORDER — ALUM & MAG HYDROXIDE-SIMETH 200-200-20 MG/5ML PO SUSP
30.0000 mL | ORAL | Status: DC | PRN
  Administered 2017-09-20 (×2): 30 mL via ORAL
  Filled 2017-09-19 (×2): qty 30

## 2017-09-19 MED ORDER — HYDROMORPHONE HCL 2 MG PO TABS: 2.0000 mg | ORAL_TABLET | ORAL | 0 refills | Status: DC | PRN

## 2017-09-19 MED ORDER — TRAMADOL HCL 50 MG PO TABS: 50.0000 mg | ORAL_TABLET | Freq: Four times a day (QID) | ORAL | 0 refills | Status: DC | PRN

## 2017-09-19 MED ORDER — OMEPRAZOLE 20 MG PO CPDR
20.0000 mg | DELAYED_RELEASE_CAPSULE | Freq: Every day | ORAL | Status: DC
  Filled 2017-09-19: qty 1

## 2017-09-19 MED ORDER — ESOMEPRAZOLE MAGNESIUM 20 MG PO CPDR
20.0000 mg | DELAYED_RELEASE_CAPSULE | Freq: Every day | ORAL | Status: DC
  Administered 2017-09-19 – 2017-09-20 (×2): 20 mg via ORAL
  Filled 2017-09-19 (×2): qty 1

## 2017-09-19 MED ORDER — HYDROMORPHONE HCL 2 MG PO TABS
2.0000 mg | ORAL_TABLET | ORAL | Status: DC | PRN
  Administered 2017-09-19 – 2017-09-20 (×6): 2 mg via ORAL
  Filled 2017-09-19 (×3): qty 1

## 2017-09-19 NOTE — Discharge Summary (Signed)
Physician Discharge Summary   Patient ID: Cynthia Dickson MRN: [id number] DOB/AGE: 58-Sep-1962 65 y.o.  Admit date: 09/18/2017 Discharge date: 09/20/2017  Primary Diagnosis:  Osteoarthritis  Left knee(s)   Admission Diagnoses:  Past Medical History:  Diagnosis Date  . Asthma   . GERD (gastroesophageal reflux disease)   . Hypertension   . Sleep apnea    Discharge Diagnoses:   Principal Problem:   OA (osteoarthritis) of knee  Estimated body mass index is 37.56 kg/m as calculated from the following:   Height as of this encounter: _0  (1.727 m).   Weight as of this encounter: 112 kg (247 lb).  Procedure:  Procedure(s) (LRB): LEFT TOTAL KNEE ARTHROPLASTY (Left)   Consults: None  HPI: Cynthia Dickson is a 56 y.o. year old female with end stage OA of her left knee with progressively worsening pain and dysfunction. She has constant pain, with activity and at rest and significant functional deficits with difficulties even with ADLs. She has had extensive non-op management including analgesics, injections of cortisone, and home exercise program, but remains in significant pain with significant dysfunction. Radiographs show bone on bone arthritis medial and patellofemoral. She presents now for left Total Knee Arthroplasty.     Laboratory Data: Admission on 09/18/2017  Component Date Value Ref Range Status  . WBC 09/19/2017 10.8* 4.0 - 10.5 K/uL Final  . RBC 09/19/2017 4.16  3.87 - 5.11 MIL/uL Final  . Hemoglobin 09/19/2017 12.2  12.0 - 15.0 g/dL Final  . HCT 09/19/2017 36.7  36.0 - 46.0 % Final  . MCV 09/19/2017 88.2  78.0 - 100.0 fL Final  . MCH 09/19/2017 29.3  26.0 - 34.0 pg Final  . MCHC 09/19/2017 33.2  30.0 - 36.0 g/dL Final  . RDW 09/19/2017 14.5  11.5 - 15.5 % Final  . Platelets 09/19/2017 205  150 - 400 K/uL Final  . Sodium 09/19/2017 139  135 - 145 mmol/L Final  . Potassium 09/19/2017 4.0  3.5 - 5.1 mmol/L Final  . Chloride 09/19/2017 105  101 - 111 mmol/L Final    . CO2 09/19/2017 25  22 - 32 mmol/L Final  . Glucose, Bld 09/19/2017 142* 65 - 99 mg/dL Final  . BUN 09/19/2017 12  6 - 20 mg/dL Final  . Creatinine, Ser 09/19/2017 0.90  0.44 - 1.00 mg/dL Final  . Calcium 09/19/2017 8.8* 8.9 - 10.3 mg/dL Final  . GFR calc non Af Amer 09/19/2017 >60  >60 mL/min Final  . GFR calc Af Amer 09/19/2017 >60  >60 mL/min Final   Comment: (NOTE) The eGFR has been calculated using the CKD EPI equation. This calculation has not been validated in all clinical situations. eGFR's persistently <60 mL/min signify possible Chronic Kidney Disease.   Georgiann Hahn gap 09/19/2017 9  5 - 15 Final  Hospital Outpatient Visit on 09/11/2017  Component Date Value Ref Range Status  . aPTT 09/11/2017 34  24 - 36 seconds Final  . WBC 09/11/2017 4.7  4.0 - 10.5 K/uL Final  . RBC 09/11/2017 4.32  3.87 - 5.11 MIL/uL Final  . Hemoglobin 09/11/2017 12.7  12.0 - 15.0 g/dL Final  . HCT 09/11/2017 38.1  36.0 - 46.0 % Final  . MCV 09/11/2017 88.2  78.0 - 100.0 fL Final  . MCH 09/11/2017 29.4  26.0 - 34.0 pg Final  . MCHC 09/11/2017 33.3  30.0 - 36.0 g/dL Final  . RDW 09/11/2017 14.7  11.5 - 15.5 % Final  . Platelets 09/11/2017 201  150 - 400 K/uL Final  . Sodium 09/11/2017 141  135 - 145 mmol/L Final  . Potassium 09/11/2017 3.9  3.5 - 5.1 mmol/L Final  . Chloride 09/11/2017 106  101 - 111 mmol/L Final  . CO2 09/11/2017 30  22 - 32 mmol/L Final  . Glucose, Bld 09/11/2017 120* 65 - 99 mg/dL Final  . BUN 09/11/2017 13  6 - 20 mg/dL Final  . Creatinine, Ser 09/11/2017 1.04* 0.44 - 1.00 mg/dL Final  . Calcium 09/11/2017 9.5  8.9 - 10.3 mg/dL Final  . Total Protein 09/11/2017 7.6  6.5 - 8.1 g/dL Final  . Albumin 09/11/2017 4.1  3.5 - 5.0 g/dL Final  . AST 09/11/2017 23  15 - 41 U/L Final  . ALT 09/11/2017 21  14 - 54 U/L Final  . Alkaline Phosphatase 09/11/2017 57  38 - 126 U/L Final  . Total Bilirubin 09/11/2017 0.9  0.3 - 1.2 mg/dL Final  . GFR calc non Af Amer 09/11/2017 58* >60 mL/min  Final  . GFR calc Af Amer 09/11/2017 >60  >60 mL/min Final   Comment: (NOTE) The eGFR has been calculated using the CKD EPI equation. This calculation has not been validated in all clinical situations. eGFR's persistently <60 mL/min signify possible Chronic Kidney Disease.   . Anion gap 09/11/2017 5  5 - 15 Final  . Prothrombin Time 09/11/2017 12.9  11.4 - 15.2 seconds Final  . INR 09/11/2017 0.98   Final  . ABO/RH(D) 09/11/2017 O POS   Final  . Antibody Screen 09/11/2017 NEG   Final  . Sample Expiration 09/11/2017 09/21/2017   Final  . Extend sample reason 09/11/2017 NO TRANSFUSIONS OR PREGNANCY IN THE PAST 3 MONTHS   Final  . MRSA, PCR 09/11/2017 NEGATIVE  NEGATIVE Final  . Staphylococcus aureus 09/11/2017 NEGATIVE  NEGATIVE Final   Comment: (NOTE) The Xpert SA Assay (FDA approved for NASAL specimens in patients 50 years of age and older), is one component of a comprehensive surveillance program. It is not intended to diagnose infection nor to guide or monitor treatment.   . ABO/RH(D) 09/11/2017 O POS   Final     X-Rays:No results found.  EKG: Orders placed or performed during the hospital encounter of 09/11/17  . EKG 12 lead  . EKG 12 lead     Hospital Course: ASHELY Dickson is a 56 y.o. who was admitted to [facility]. They were brought to the operating room on 09/18/2017 and underwent Procedure(s): LEFT TOTAL KNEE ARTHROPLASTY.  Patient tolerated the procedure well and was later transferred to the recovery room and then to the orthopaedic floor for postoperative care.  They were given PO and IV analgesics for pain control following their surgery.  They were given 24 hours of postoperative antibiotics of  Anti-infectives (From admission, onward)   Start     Dose/Rate Route Frequency Ordered Stop   09/18/17 2100  ceFAZolin (ANCEF) IVPB 2g/100 mL premix     2 g 200 mL/hr over 30 Minutes Intravenous Every 6 hours 09/18/17 1728 09/19/17 0454   09/18/17 1120   ceFAZolin (ANCEF) 2-4 GM/100ML-% IVPB    Comments:  Harvell, Gwendolyn  : cabinet override      09/18/17 1120 09/18/17 1330   09/18/17 1116  ceFAZolin (ANCEF) IVPB 2g/100 mL premix     2 g 200 mL/hr over 30 Minutes Intravenous On call to O.R. 09/18/17 1116 09/18/17 1400     and started on DVT prophylaxis in the form  of Xarelto.   PT and OT were ordered for total joint protocol.  Discharge planning consulted to help with postop disposition and equipment needs.  Patient had a rough night on the evening of surgery with pain.  They started to get up OOB with therapy on day one. Hemovac drain was pulled without difficulty.  Continued to work with therapy into day two.  Dressing was changed on day two and the incision was healing well.  Her second night was better.  Rested a little more.  She had some reflux issues and has complaints of burning this morning.  GI cocktail helped that morning.  She only walked once on day one.  Patient was seen in rounds on day two, doing better and was ready to go home.  Diet - Cardiac diet Follow up - in 2 weeks Activity - WBAT Disposition - Home Condition Upon Discharge - Stable D/C Meds - See DC Summary DVT Prophylaxis - Xarelto     Discharge Instructions    Call MD / Call 911   Complete by:  As directed    If you experience chest pain or shortness of breath, CALL 911 and be transported to the hospital emergency room.  If you develope a fever above 101 F, pus (white drainage) or increased drainage or redness at the wound, or calf pain, call your surgeon's office.   Change dressing   Complete by:  As directed    Change dressing daily with sterile 4 x 4 inch gauze dressing and apply TED hose. Do not submerge the incision under water.   Constipation Prevention   Complete by:  As directed    Drink plenty of fluids.  Prune juice may be helpful.  You may use a stool softener, such as Colace (over the counter) 100 mg twice a day.  Use MiraLax (over the counter) for  constipation as needed.   Diet - low sodium heart healthy   Complete by:  As directed    Discharge instructions   Complete by:  As directed    Take Xarelto for two and a half more weeks, then discontinue Xarelto. Once the patient has completed the blood thinner regimen, then take a Baby 81 mg Aspirin daily for three more weeks.   Pick up stool softner and laxative for home use following surgery while on pain medications. Do not submerge incision under water. Please use good hand washing techniques while changing dressing each day. May shower starting three days after surgery. Please use a clean towel to pat the incision dry following showers. Continue to use ice for pain and swelling after surgery. Do not use any lotions or creams on the incision until instructed by your surgeon.  Wear both TED hose on both legs during the day every day for three weeks, but may remove the TED hose at night at home.  Postoperative Constipation Protocol  Constipation - defined medically as fewer than three stools per week and severe constipation as less than one stool per week.  One of the most common issues patients have following surgery is constipation.  Even if you have a regular bowel pattern at home, your normal regimen is likely to be disrupted due to multiple reasons following surgery.  Combination of anesthesia, postoperative narcotics, change in appetite and fluid intake all can affect your bowels.  In order to avoid complications following surgery, here are some recommendations in order to help you during your recovery period.  Colace (docusate) - Pick up an  over-the-counter form of Colace or another stool softener and take twice a day as long as you are requiring postoperative pain medications.  Take with a full glass of water daily.  If you experience loose stools or diarrhea, hold the colace until you stool forms back up.  If your symptoms do not get better within 1 week or if they get worse,  check with your doctor.  Dulcolax (bisacodyl) - Pick up over-the-counter and take as directed by the product packaging as needed to assist with the movement of your bowels.  Take with a full glass of water.  Use this product as needed if not relieved by Colace only.   MiraLax (polyethylene glycol) - Pick up over-the-counter to have on hand.  MiraLax is a solution that will increase the amount of water in your bowels to assist with bowel movements.  Take as directed and can mix with a glass of water, juice, soda, coffee, or tea.  Take if you go more than two days without a movement. Do not use MiraLax more than once per day. Call your doctor if you are still constipated or irregular after using this medication for 7 days in a row.  If you continue to have problems with postoperative constipation, please contact the office for further assistance and recommendations.  If you experience "the worst abdominal pain ever" or develop nausea or vomiting, please contact the office immediatly for further recommendations for treatment.   Do not put a pillow under the knee. Place it under the heel.   Complete by:  As directed    Do not sit on low chairs, stoools or toilet seats, as it may be difficult to get up from low surfaces   Complete by:  As directed    Driving restrictions   Complete by:  As directed    No driving until released by the physician.   Increase activity slowly as tolerated   Complete by:  As directed    Lifting restrictions   Complete by:  As directed    No lifting until released by the physician.   Patient may shower   Complete by:  As directed    You may shower without a dressing once there is no drainage.  Do not wash over the wound.  If drainage remains, do not shower until drainage stops.   TED hose   Complete by:  As directed    Use stockings (TED hose) for 3 weeks on both leg(s).  You may remove them at night for sleeping.   Weight bearing as tolerated   Complete by:  As  directed    Laterality:  left   Extremity:  Lower     Allergies as of 09/19/2017      Reactions   Codeine Shortness Of Breath, Itching   Latex Itching, Swelling   Latex balloons cause lips to swell       Medication List    STOP taking these medications   celecoxib 200 MG capsule Commonly known as:  CELEBREX   ibuprofen 200 MG tablet Commonly known as:  ADVIL,MOTRIN     TAKE these medications   benazepril-hydrochlorthiazide 20-12.5 MG tablet Commonly known as:  LOTENSIN HCT Take 1 tablet every other day by mouth.   furosemide 20 MG tablet Commonly known as:  LASIX Take 20 mg daily as needed by mouth for fluid or edema.   gabapentin 100 MG capsule Commonly known as:  NEURONTIN Take 100 mg 2 (two) times daily as  needed by mouth (pain).   HYDROmorphone 2 MG tablet Commonly known as:  DILAUDID Take 1-2 tablets (2-4 mg total) by mouth every 4 (four) hours as needed for moderate pain or severe pain.   methocarbamol 500 MG tablet Commonly known as:  ROBAXIN Take 1 tablet (500 mg total) by mouth every 6 (six) hours as needed for muscle spasms.   rivaroxaban 10 MG Tabs tablet Commonly known as:  XARELTO Take 1 tablet (10 mg total) by mouth daily with breakfast. Take Xarelto for two and a half more weeks following discharge from the hospital, then discontinue Xarelto. Once the patient has completed the blood thinner regimen, then take a Baby 81 mg Aspirin daily for three more weeks. Start taking on:  09/20/2017   traMADol 50 MG tablet Commonly known as:  ULTRAM Take 1-2 tablets (50-100 mg total) by mouth every 6 (six) hours as needed for moderate pain (if refractory to Dilaudid 2 mg PO).            Discharge Care Instructions  (From admission, onward)        Start     Ordered   09/19/17 0000  Weight bearing as tolerated    Question Answer Comment  Laterality left   Extremity Lower      09/19/17 2203   09/19/17 0000  Change dressing    Comments:  Change  dressing daily with sterile 4 x 4 inch gauze dressing and apply TED hose. Do not submerge the incision under water.   09/19/17 2203     Follow-up Information    Gaynelle Arabian, MD. Schedule an appointment as soon as possible for a visit on 10/03/2017.   Specialty:  Orthopedic Surgery Contact information: [address] [place] [address] [phone]           Signed: Arlee Muslim, PA-C Orthopaedic Surgery 09/19/2017, 10:04 PM

## 2017-09-19 NOTE — Discharge Instructions (Signed)
°  ° °Dr. Frank Aluisio °Total Joint Specialist °[facility] °[address] °[place], [place] [address] °[phone] ° °TOTAL KNEE REPLACEMENT POSTOPERATIVE DIRECTIONS ° °Knee Rehabilitation, Guidelines Following Surgery  °Results after knee surgery are often greatly improved when you follow the exercise, range of motion and muscle strengthening exercises prescribed by your doctor. Safety measures are also important to protect the knee from further injury. Any time any of these exercises cause you to have increased pain or swelling in your knee joint, decrease the amount until you are comfortable again and slowly increase them. If you have problems or questions, call your caregiver or physical therapist for advice.  ° °HOME CARE INSTRUCTIONS  °Remove items at home which could result in a fall. This includes throw rugs or furniture in walking pathways.  °· ICE to the affected knee every three hours for 30 minutes at a time and then as needed for pain and swelling.  Continue to use ice on the knee for pain and swelling from surgery. You may notice swelling that will progress down to the foot and ankle.  This is normal after surgery.  Elevate the leg when you are not up walking on it.   °· Continue to use the breathing machine which will help keep your temperature down.  It is common for your temperature to cycle up and down following surgery, especially at night when you are not up moving around and exerting yourself.  The breathing machine keeps your lungs expanded and your temperature down. °· Do not place pillow under knee, focus on keeping the knee straight while resting ° °DIET °You may resume your previous home diet once your are discharged from the hospital. ° °DRESSING / WOUND CARE / SHOWERING °You may shower 3 days after surgery, but keep the wounds dry during showering.  You may use an occlusive plastic wrap (Press'n Seal for example), NO SOAKING/SUBMERGING IN THE BATHTUB.  If the  bandage gets wet, change with a clean dry gauze.  If the incision gets wet, pat the wound dry with a clean towel. °You may start showering once you are discharged home but do not submerge the incision under water. Just pat the incision dry and apply a dry gauze dressing on daily. °Change the surgical dressing daily and reapply a dry dressing each time. ° °ACTIVITY °Walk with your walker as instructed. °Use walker as long as suggested by your caregivers. °Avoid periods of inactivity such as sitting longer than an hour when not asleep. This helps prevent blood clots.  °You may resume a sexual relationship in one month or when given the OK by your doctor.  °You may return to work once you are cleared by your doctor.  °Do not drive a car for 6 weeks or until released by you surgeon.  °Do not drive while taking narcotics. ° °WEIGHT BEARING °Weight bearing as tolerated with assist device (walker, cane, etc) as directed, use it as long as suggested by your surgeon or therapist, typically at least 4-6 weeks. ° °POSTOPERATIVE CONSTIPATION PROTOCOL °Constipation - defined medically as fewer than three stools per week and severe constipation as less than one stool per week. ° °One of the most common issues patients have following surgery is constipation.  Even if you have a regular bowel pattern at home, your normal regimen is likely to be disrupted due to multiple reasons following surgery.  Combination of anesthesia, postoperative narcotics, change in appetite and fluid intake all can affect your   bowels.  In order to avoid complications following surgery, here are some recommendations in order to help you during your recovery period. ° °Colace (docusate) - Pick up an over-the-counter form of Colace or another stool softener and take twice a day as long as you are requiring postoperative pain medications.  Take with a full glass of water daily.  If you experience loose stools or diarrhea, hold the colace until you stool forms  back up.  If your symptoms do not get better within 1 week or if they get worse, check with your doctor. ° °Dulcolax (bisacodyl) - Pick up over-the-counter and take as directed by the product packaging as needed to assist with the movement of your bowels.  Take with a full glass of water.  Use this product as needed if not relieved by Colace only.  ° °MiraLax (polyethylene glycol) - Pick up over-the-counter to have on hand.  MiraLax is a solution that will increase the amount of water in your bowels to assist with bowel movements.  Take as directed and can mix with a glass of water, juice, soda, coffee, or tea.  Take if you go more than two days without a movement. °Do not use MiraLax more than once per day. Call your doctor if you are still constipated or irregular after using this medication for 7 days in a row. ° °If you continue to have problems with postoperative constipation, please contact the office for further assistance and recommendations.  If you experience "the worst abdominal pain ever" or develop nausea or vomiting, please contact the office immediatly for further recommendations for treatment. ° °ITCHING ° If you experience itching with your medications, try taking only a single pain pill, or even half a pain pill at a time.  You can also use Benadryl over the counter for itching or also to help with sleep.  ° °TED HOSE STOCKINGS °Wear the elastic stockings on both legs for three weeks following surgery during the day but you may remove then at night for sleeping. ° °MEDICATIONS °See your medication summary on the “After Visit Summary” that the nursing staff will review with you prior to discharge.  You may have some home medications which will be placed on hold until you complete the course of blood thinner medication.  It is important for you to complete the blood thinner medication as prescribed by your surgeon.  Continue your approved medications as instructed at time of  discharge. ° °PRECAUTIONS °If you experience chest pain or shortness of breath - call 911 immediately for transfer to the hospital emergency department.  °If you develop a fever greater that 101 F, purulent drainage from wound, increased redness or drainage from wound, foul odor from the wound/dressing, or calf pain - CONTACT YOUR SURGEON.   °                                                °FOLLOW-UP APPOINTMENTS °Make sure you keep all of your appointments after your operation with your surgeon and caregivers. You should call the office at the above phone number and make an appointment for approximately two weeks after the date of your surgery or on the date instructed by your surgeon outlined in the "After Visit Summary". ° ° °RANGE OF MOTION AND STRENGTHENING EXERCISES  °Rehabilitation of the knee is important following a knee   injury or an operation. After just a few days of immobilization, the muscles of the thigh which control the knee become weakened and shrink (atrophy). Knee exercises are designed to build up the tone and strength of the thigh muscles and to improve knee motion. Often times heat used for twenty to thirty minutes before working out will loosen up your tissues and help with improving the range of motion but do not use heat for the first two weeks following surgery. These exercises can be done on a training (exercise) mat, on the floor, on a table or on a bed. Use what ever works the best and is most comfortable for you Knee exercises include:  °Leg Lifts - While your knee is still immobilized in a splint or cast, you can do straight leg raises. Lift the leg to 60 degrees, hold for 3 sec, and slowly lower the leg. Repeat 10-20 times 2-3 times daily. Perform this exercise against resistance later as your knee gets better.  °Quad and Hamstring Sets - Tighten up the muscle on the front of the thigh (Quad) and hold for 5-10 sec. Repeat this 10-20 times hourly. Hamstring sets are done by pushing the  foot backward against an object and holding for 5-10 sec. Repeat as with quad sets.  °· Leg Slides: Lying on your back, slowly slide your foot toward your buttocks, bending your knee up off the floor (only go as far as is comfortable). Then slowly slide your foot back down until your leg is flat on the floor again. °· Angel Wings: Lying on your back spread your legs to the side as far apart as you can without causing discomfort.  °A rehabilitation program following serious knee injuries can speed recovery and prevent re-injury in the future due to weakened muscles. Contact your doctor or a physical therapist for more information on knee rehabilitation.  ° °IF YOU ARE TRANSFERRED TO A SKILLED REHAB FACILITY °If the patient is transferred to a skilled rehab facility following release from the hospital, a list of the current medications will be sent to the facility for the patient to continue.  When discharged from the skilled rehab facility, please have the facility set up the patient's Home Health Physical Therapy prior to being released. Also, the skilled facility will be responsible for providing the patient with their medications at time of release from the facility to include their pain medication, the muscle relaxants, and their blood thinner medication. If the patient is still at the rehab facility at time of the two week follow up appointment, the skilled rehab facility will also need to assist the patient in arranging follow up appointment in our office and any transportation needs. ° °MAKE SURE YOU:  °Understand these instructions.  °Get help right away if you are not doing well or get worse.  ° ° °Pick up stool softner and laxative for home use following surgery while on pain medications. °Do not submerge incision under water. °Please use good hand washing techniques while changing dressing each day. °May shower starting three days after surgery. °Please use a clean towel to pat the incision dry following  showers. °Continue to use ice for pain and swelling after surgery. °Do not use any lotions or creams on the incision until instructed by your surgeon. ° °Take Xarelto for two and a half more weeks following discharge from the hospital, then discontinue Xarelto. °Once the patient has completed the blood thinner regimen, then take a Baby 81 mg Aspirin daily   for three more weeks. ° ° °Information on my medicine - XARELTO® (Rivaroxaban) ° ° °Why was Xarelto® prescribed for you? °Xarelto® was prescribed for you to reduce the risk of blood clots forming after orthopedic surgery. The medical term for these abnormal blood clots is venous thromboembolism (VTE). ° °What do you need to know about xarelto® ? °Take your Xarelto® ONCE DAILY at the same time every day. °You may take it either with or without food. ° °If you have difficulty swallowing the tablet whole, you may crush it and mix in applesauce just prior to taking your dose. ° °Take Xarelto® exactly as prescribed by your doctor and DO NOT stop taking Xarelto® without talking to the doctor who prescribed the medication.  Stopping without other VTE prevention medication to take the place of Xarelto® may increase your risk of developing a clot. ° °After discharge, you should have regular check-up appointments with your healthcare provider that is prescribing your Xarelto®.   ° °What do you do if you miss a dose? °If you miss a dose, take it as soon as you remember on the same day then continue your regularly scheduled once daily regimen the next day. Do not take two doses of Xarelto® on the same day.  ° °Important Safety Information °A possible side effect of Xarelto® is bleeding. You should call your healthcare provider right away if you experience any of the following: °? Bleeding from an injury or your nose that does not stop. °? Unusual colored urine (red or dark brown) or unusual colored stools (red or black). °? Unusual bruising for unknown reasons. °? A serious  fall or if you hit your head (even if there is no bleeding). ° °Some medicines may interact with Xarelto® and might increase your risk of bleeding while on Xarelto®. To help avoid this, consult your healthcare provider or pharmacist prior to using any new prescription or non-prescription medications, including herbals, vitamins, non-steroidal anti-inflammatory drugs (NSAIDs) and supplements. ° °This website has more information on Xarelto®: www.xarelto.com. ° ° °

## 2017-09-19 NOTE — Plan of Care (Signed)
Plan of care reviewed with patient and her family.  Questions answered to patient's satisfaction.

## 2017-09-19 NOTE — Evaluation (Signed)
Physical Therapy Evaluation Patient Details Name: Cynthia Dickson MRN: [id number] DOB: February 12, 1961 Today's Date: 09/19/2017   History of Present Illness  56 yo female admitted with L TKA.   Clinical Impression  On eval, pt required Min assist for mobility. She walked ~50 feet with a RW. Pain rated 7/10. Pt had issues with pain control and nausea earlier today, so we did not have a morning session. Pt participated well during this afternoon session. Will follow and progress activity as tolerated.     Follow Up Recommendations DC plan and follow up therapy as arranged by surgeon (OP PT)    Equipment Recommendations  Rolling walker with 5" wheels    Recommendations for Other Services OT consult     Precautions / Restrictions Precautions Precautions: Fall Restrictions Weight Bearing Restrictions: No      Mobility  Bed Mobility Overal bed mobility: Needs Assistance Bed Mobility: Supine to Sit     Supine to sit: Min assist;HOB elevated     General bed mobility comments: Assist for L LE.   Transfers Overall transfer level: Needs assistance Equipment used: Rolling walker (2 wheeled) Transfers: Sit to/from Stand Sit to Stand: Min assist;From elevated surface         General transfer comment: VCS safety, technique, hand/LE placement. Assist to steady while rising.   Ambulation/Gait Ambulation/Gait assistance: Min guard Ambulation Distance (Feet): 50 Feet Assistive device: Rolling walker (2 wheeled) Gait Pattern/deviations: Step-to pattern;Antalgic;Decreased stance time - left     General Gait Details: slow gait speed. VCs safety, technique, sequence. Pt c/o mild lightheadedness.   Stairs            Wheelchair Mobility    Modified Rankin (Stroke Patients Only)       Balance                                             Pertinent Vitals/Pain Pain Assessment: 0-10 Pain Score: 7  Pain Location: L knee Pain Descriptors / Indicators:  Aching;Sore Pain Intervention(s): RN gave pain meds during session    Home Living Family/patient expects to be discharged to:: Private residence Living Arrangements: Spouse/significant other Available Help at Discharge: Family Type of Home: House Home Access: Level entry     Home Layout: Two level;Able to live on main level with bedroom/bathroom Home Equipment: None      Prior Function Level of Independence: Independent               Hand Dominance        Extremity/Trunk Assessment   Upper Extremity Assessment Upper Extremity Assessment: Defer to OT evaluation    Lower Extremity Assessment Lower Extremity Assessment: Generalized weakness(s/p L TKA)    Cervical / Trunk Assessment Cervical / Trunk Assessment: Normal  Communication   Communication: No difficulties  Cognition Arousal/Alertness: Awake/alert Behavior During Therapy: WFL for tasks assessed/performed Overall Cognitive Status: Within Functional Limits for tasks assessed                                        General Comments      Exercises Total Joint Exercises Ankle Circles/Pumps: AROM;Both;10 reps;Supine Quad Sets: AROM;Both;10 reps;Supine Heel Slides: AAROM;Left;10 reps;Supine Hip ABduction/ADduction: AAROM;Left;10 reps;Supine Goniometric ROM: ~10-55 degrees   Assessment/Plan    PT Assessment Patient needs  continued PT services  PT Problem List Decreased strength;Decreased range of motion;Decreased balance;Decreased mobility;Decreased knowledge of precautions;Pain;Decreased activity tolerance;Decreased knowledge of use of DME       PT Treatment Interventions DME instruction;Functional mobility training;Balance training;Patient/family education;Gait training;Therapeutic activities;Therapeutic exercise    PT Goals (Current goals can be found in the Care Plan section)  Acute Rehab PT Goals Patient Stated Goal: less pain. regain PLOF.  PT Goal Formulation: With  patient/family Time For Goal Achievement: 10/03/17 Potential to Achieve Goals: Good    Frequency 7X/week   Barriers to discharge        Co-evaluation               AM-PAC PT "6 Clicks" Daily Activity  Outcome Measure Difficulty turning over in bed (including adjusting bedclothes, sheets and blankets)?: Unable Difficulty moving from lying on back to sitting on the side of the bed? : Unable Difficulty sitting down on and standing up from a chair with arms (e.g., wheelchair, bedside commode, etc,.)?: Unable   Help needed walking in hospital room?: A Little Help needed climbing 3-5 steps with a railing? : A Lot 6 Click Score: 8    End of Session Equipment Utilized During Treatment: Gait belt;Left knee immobilizer Activity Tolerance: Patient tolerated treatment well Patient left: in bed;with call bell/phone within reach;with family/visitor present   PT Visit Diagnosis: Muscle weakness (generalized) (M62.81);Difficulty in walking, not elsewhere classified (R26.2)    Time: 0932-6712 PT Time Calculation (min) (ACUTE ONLY): 32 min   Charges:   PT Evaluation $PT Eval Moderate Complexity: 1 Mod PT Treatments $Gait Training: 8-22 mins   PT G Codes:          Rebeca Alert, MPT Pager: [phone]

## 2017-09-19 NOTE — Progress Notes (Signed)
   Subjective: 1 Day Post-Op Procedure(s) (LRB): LEFT TOTAL KNEE ARTHROPLASTY (Left) Patient reports pain as moderate.  Rough night. Patient seen in rounds for Dr. Lequita Halt.  Husband in room at bedside. Patient is well, but has had some minor complaints of pain in the knee, requiring pain medications We will start therapy today. Also had some reflux issues.  Will add prn meds. Plan is to go Home after hospital stay.  Objective: Vital signs in last 24 hours: Temp:  [97.3 F (36.3 C)-98.1 F (36.7 C)] 97.6 F (36.4 C) (11/20 0543) Pulse Rate:  [49-82] 73 (11/20 0543) Resp:  [11-25] 15 (11/20 0543) BP: (124-179)/(66-92) 124/66 (11/20 0543) SpO2:  [98 %-100 %] 98 % (11/20 0543) Weight:  [112 kg (247 lb)] 112 kg (247 lb) (11/19 1142)  Intake/Output from previous day:  Intake/Output Summary (Last 24 hours) at 09/19/2017 1016 Last data filed at 09/19/2017 0640 Gross per 24 hour  Intake 4356.25 ml  Output 2670 ml  Net 1686.25 ml    Intake/Output this shift: No intake/output data recorded.  Labs: Recent Labs    09/19/17 0547  HGB 12.2   Recent Labs    09/19/17 0547  WBC 10.8*  RBC 4.16  HCT 36.7  PLT 205   Recent Labs    09/19/17 0547  NA 139  K 4.0  CL 105  CO2 25  BUN 12  CREATININE 0.90  GLUCOSE 142*  CALCIUM 8.8*   No results for input(s): LABPT, INR in the last 72 hours.  EXAM General - Patient is Alert, Appropriate and Oriented Extremity - Neurovascular intact Sensation intact distally Intact pulses distally Dorsiflexion/Plantar flexion intact Dressing - dressing C/D/I Motor Function - intact, moving foot and toes well on exam.  Hemovac pulled without difficulty.  Past Medical History:  Diagnosis Date  . Asthma   . GERD (gastroesophageal reflux disease)   . Hypertension   . Sleep apnea     Assessment/Plan: 1 Day Post-Op Procedure(s) (LRB): LEFT TOTAL KNEE ARTHROPLASTY (Left) Principal Problem:   OA (osteoarthritis) of knee  Estimated  body mass index is 37.56 kg/m as calculated from the following:   Height as of this encounter: 5\' 8"  (1.727 m).   Weight as of this encounter: 112 kg (247 lb). Advance diet Up with therapy Plan for discharge tomorrow Discharge home - outpatient therapy  DVT Prophylaxis - Xarelto Weight-Bearing as tolerated to left leg D/C O2 and Pulse OX and try on Room Air  , PA-C Orthopaedic Surgery 09/19/2017, 10:16 AM

## 2017-09-20 ENCOUNTER — Inpatient Hospital Stay (HOSPITAL_COMMUNITY): Payer: PRIVATE HEALTH INSURANCE

## 2017-09-20 DIAGNOSIS — M79609 Pain in unspecified limb: Secondary | ICD-10-CM

## 2017-09-20 LAB — BASIC METABOLIC PANEL
ANION GAP: 5 (ref 5–15)
BUN: 12 mg/dL (ref 6–20)
CHLORIDE: 104 mmol/L (ref 101–111)
CO2: 27 mmol/L (ref 22–32)
Calcium: 8.5 mg/dL — ABNORMAL LOW (ref 8.9–10.3)
Creatinine, Ser: 0.93 mg/dL (ref 0.44–1.00)
Glucose, Bld: 112 mg/dL — ABNORMAL HIGH (ref 65–99)
POTASSIUM: 3.8 mmol/L (ref 3.5–5.1)
Sodium: 136 mmol/L (ref 135–145)

## 2017-09-20 LAB — CBC
HCT: 35.2 % — ABNORMAL LOW (ref 36.0–46.0)
HEMOGLOBIN: 11.6 g/dL — AB (ref 12.0–15.0)
MCH: 29.2 pg (ref 26.0–34.0)
MCHC: 33 g/dL (ref 30.0–36.0)
MCV: 88.7 fL (ref 78.0–100.0)
PLATELETS: 189 10*3/uL (ref 150–400)
RBC: 3.97 MIL/uL (ref 3.87–5.11)
RDW: 14.7 % (ref 11.5–15.5)
WBC: 11.7 10*3/uL — AB (ref 4.0–10.5)

## 2017-09-20 MED ORDER — GI COCKTAIL ~~LOC~~
30.0000 mL | Freq: Once | ORAL | Status: AC
  Administered 2017-09-20: 30 mL via ORAL
  Filled 2017-09-20: qty 30

## 2017-09-20 NOTE — Progress Notes (Signed)
Spoke with patient at bedside. Confirmed plan for OP PT, already arranged. Needs RW, contacted [facility] to deliver to room. [phone] 

## 2017-09-20 NOTE — Progress Notes (Signed)
   Subjective: 2 Days Post-Op Procedure(s) (LRB): LEFT TOTAL KNEE ARTHROPLASTY (Left) Patient reports pain as mild.   Patient seen in rounds for Dr. Lequita Halt. Second night was better.  Rested a little more.  She has had some reflux issues and has complaints of burning this morning.  Will order GI cocktail this morning.  She only walked once yesterday.    Patient is well, but has had some minor complaints of pain in the knee, requiring pain medications Patient is ready to go home but she will need two sessions of therapy today prior to discharge.  Objective: Vital signs in last 24 hours: Temp:  [97.7 F (36.5 C)-98.5 F (36.9 C)] 97.8 F (36.6 C) (11/21 0600) Pulse Rate:  [60-69] 66 (11/21 0600) Resp:  [16-17] 17 (11/21 0600) BP: (124-136)/(59-66) 127/64 (11/21 0600) SpO2:  [96 %-100 %] 100 % (11/21 0600)  Intake/Output from previous day:  Intake/Output Summary (Last 24 hours) at 09/20/2017 0808 Last data filed at 09/20/2017 0634 Gross per 24 hour  Intake 1446.67 ml  Output 1675 ml  Net -228.33 ml    Intake/Output this shift: No intake/output data recorded.  Labs: Recent Labs    09/19/17 0547 09/20/17 0551  HGB 12.2 11.6*   Recent Labs    09/19/17 0547 09/20/17 0551  WBC 10.8* 11.7*  RBC 4.16 3.97  HCT 36.7 35.2*  PLT 205 189   Recent Labs    09/19/17 0547 09/20/17 0551  NA 139 136  K 4.0 3.8  CL 105 104  CO2 25 27  BUN 12 12  CREATININE 0.90 0.93  GLUCOSE 142* 112*  CALCIUM 8.8* 8.5*   No results for input(s): LABPT, INR in the last 72 hours.  EXAM: General - Patient is Alert, Appropriate and Oriented Extremity - Neurovascular intact Sensation intact distally Intact pulses distally Dorsiflexion/Plantar flexion intact Incision - clean, dry, no drainage, healing Motor Function - intact, moving foot and toes well on exam.   Assessment/Plan: 2 Days Post-Op Procedure(s) (LRB): LEFT TOTAL KNEE ARTHROPLASTY (Left) Procedure(s) (LRB): LEFT TOTAL KNEE  ARTHROPLASTY (Left) Past Medical History:  Diagnosis Date  . Asthma   . GERD (gastroesophageal reflux disease)   . Hypertension   . Sleep apnea    Principal Problem:   OA (osteoarthritis) of knee  Estimated body mass index is 37.56 kg/m as calculated from the following:   Height as of this encounter: 5\' 8"  (1.727 m).   Weight as of this encounter: 112 kg (247 lb). Up with therapy Diet - Cardiac diet Follow up - in 2 weeks Activity - WBAT Disposition - Home Condition Upon Discharge - Stable D/C Meds - See DC Summary DVT Prophylaxis - Xarelto  , PA-C Orthopaedic Surgery 09/20/2017, 8:08 AM

## 2017-09-20 NOTE — Progress Notes (Signed)
Left lower extremity venous duplex has been completed. Negative for DVT.  09/20/17 2:48 PM Olen Cordial RVT

## 2017-09-20 NOTE — Evaluation (Signed)
Occupational Therapy Evaluation Patient Details Name: ARBELL WYCOFF MRN: [id number] DOB: 1961/09/14 Today's Date: 09/20/2017    History of Present Illness 56 yo female admitted with L TKA.    Clinical Impression   Pt was admitted for the above sx.  All education was completed. No further OT is needed at this time    Follow Up Recommendations  Supervision/Assistance - 24 hour    Equipment Recommendations  None recommended by OT    Recommendations for Other Services       Precautions / Restrictions Precautions Precautions: Fall Restrictions Weight Bearing Restrictions: No      Mobility Bed Mobility         Supine to sit: Min assist;HOB elevated     General bed mobility comments: due to indigestion.  assist for LLE  Transfers   Equipment used: Rolling walker (2 wheeled)   Sit to Stand: Min guard         General transfer comment: for safety. Cues for UE placement    Balance                                           ADL either performed or assessed with clinical judgement   ADL Overall ADL's : Needs assistance/impaired Eating/Feeding: Independent   Grooming: Oral care;Supervision/safety;Standing   Upper Body Bathing: Set up;Sitting   Lower Body Bathing: Minimal assistance;Sit to/from stand   Upper Body Dressing : Set up;Sitting   Lower Body Dressing: Maximal assistance;Sit to/from stand   Toilet Transfer: Min guard;Ambulation;Comfort height toilet;RW;BSC   Toileting- Clothing Manipulation and Hygiene: Min guard;Sit to/from stand         General ADL Comments: ambulated to bathroom, performed ADL and used ciommode. Educated on tub readiness and DME options. Pt will sponge bathe initially     Vision         Perception     Praxis      Pertinent Vitals/Pain Pain Score: 5  Pain Location: L knee Pain Descriptors / Indicators: Aching;Sore Pain Intervention(s): Limited activity within patient's tolerance;Monitored  during session;Premedicated before session;Ice applied     Hand Dominance     Extremity/Trunk Assessment Upper Extremity Assessment Upper Extremity Assessment: Overall WFL for tasks assessed           Communication Communication Communication: No difficulties   Cognition Arousal/Alertness: Awake/alert Behavior During Therapy: WFL for tasks assessed/performed Overall Cognitive Status: Within Functional Limits for tasks assessed                                     General Comments       Exercises     Shoulder Instructions      Home Living Family/patient expects to be discharged to:: Private residence Living Arrangements: Spouse/significant other Available Help at Discharge: Family               Bathroom Shower/Tub: Chief Strategy Officer: Standard     Home Equipment: Toilet riser          Prior Functioning/Environment Level of Independence: Independent                 OT Problem List:        OT Treatment/Interventions:      OT Goals(Current goals can be found in the care plan  section) Acute Rehab 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 OT Goal Formulation: All assessment and education complete, DC therapy  OT Frequency:     Barriers to D/C:            Co-evaluation              AM-PAC PT "6 Clicks" Daily Activity     Outcome Measure Help from another person eating meals?: None Help from another person taking care of personal grooming?: A Little Help from another person toileting, which includes using toliet, bedpan, or urinal?: A Little Help from another person bathing (including washing, rinsing, drying)?: A Little Help from another person to put on and taking off regular upper body clothing?: A Little Help from another person to put on and taking off regular lower body clothing?: A Lot 6 Click Score: 18   End of Session    Activity Tolerance: Patient tolerated treatment well Patient  left: in chair;with call bell/phone within reach;with family/visitor present  OT Visit Diagnosis: Pain Pain - Right/Left: Left Pain - part of body: Knee                Time: 8182-9937 OT Time Calculation (min): 42 min Charges:  OT General Charges $OT Visit: 1 Visit OT Evaluation $OT Eval Low Complexity: 1 Low OT Treatments $Self Care/Home Management : 8-22 mins G-Codes:     [place], OTR/L [phone] 09/20/2017  Melvenia Favela 09/20/2017, 10:21 AM

## 2017-09-20 NOTE — Progress Notes (Signed)
Physical Therapy Treatment Patient Details Name: Cynthia Dickson MRN: [id number] DOB: 05/28/1961 Today's Date: 09/20/2017    History of Present Illness 56 yo female admitted with L TKA.     PT Comments    Progressing slowly. Pain issues during this session. Will plan to have a 2nd session prior to d/c later today.   Follow Up Recommendations  DC plan and follow up therapy as arranged by surgeon(OP PT)     Equipment Recommendations  Rolling walker with 5" wheels    Recommendations for Other Services OT consult     Precautions / Restrictions Precautions Precautions: Fall;Knee Required Braces or Orthoses: Knee Immobilizer - Left Knee Immobilizer - Left: Discontinue once straight leg raise with < 10 degree lag Restrictions Weight Bearing Restrictions: No    Mobility  Bed Mobility Overal bed mobility: Needs Assistance Bed Mobility: Sit to Supine      Sit to supine: Min assist;HOB elevated   General bed mobility comments: Assist for LEs.   Transfers Overall transfer level: Needs assistance Equipment used: Rolling walker (2 wheeled) Transfers: Sit to/from Stand Sit to Stand: Min guard         General transfer comment: close guard for safety. VCs hand/LE placement.   Ambulation/Gait Ambulation/Gait assistance: Min guard Ambulation Distance (Feet): 50 Feet Assistive device: Rolling walker (2 wheeled) Gait Pattern/deviations: Step-to pattern     General Gait Details: slow gait speed. VCs safety, technique, sequence. close guard for safety.    Stairs            Wheelchair Mobility    Modified Rankin (Stroke Patients Only)       Balance                                            Cognition Arousal/Alertness: Awake/alert Behavior During Therapy: WFL for tasks assessed/performed Overall Cognitive Status: Within Functional Limits for tasks assessed                                        Exercises Total Joint  Exercises Ankle Circles/Pumps: AROM;Both;Supine;15 reps Quad Sets: AROM;Both;Supine;15 reps Heel Slides: AAROM;Left;10 reps;Supine Hip ABduction/ADduction: AAROM;Left;Supine;15 reps Straight Leg Raises: AAROM;10 reps;Supine;15 reps Long Arc Quad: AAROM;Left;5 reps;Seated Knee Flexion: AAROM;Left;5 reps;Seated Goniometric ROM: ~10-55 degrees    General Comments        Pertinent Vitals/Pain Pain Assessment: 0-10 Pain Score: 9  Pain Location: L knee Pain Descriptors / Indicators: Aching;Sore Pain Intervention(s): Limited activity within patient's tolerance;Repositioned;Ice applied;Patient requesting pain meds-RN notified    Home Living Family/patient expects to be discharged to:: Private residence Living Arrangements: Spouse/significant other Available Help at Discharge: Family         Home Equipment: Toilet riser      Prior Function Level of Independence: Independent          PT Goals (current goals can now be found in the care plan section) Acute Rehab 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 : Unable Difficulty sitting down on and standing up from a chair with arms (e.g., wheelchair, bedside commode, etc,.)?: Unable Help needed moving to and from a bed to chair (including a wheelchair)?: A Little Help needed walking in hospital room?: A Little Help needed climbing 3-5 steps with a railing? : A Lot 6 Click Score: 11    End of Session Equipment Utilized During Treatment: Gait belt;Left knee immobilizer Activity Tolerance: Patient limited by pain Patient left: in bed;with  call bell/phone within reach;with family/visitor present   PT Visit Diagnosis: Muscle weakness (generalized) (M62.81);Difficulty in walking, not elsewhere classified (R26.2)     Time: 2248-2500 PT Time Calculation (min) (ACUTE ONLY): 35 min  Charges:  $Gait Training: 8-22 mins $Therapeutic Exercise: 8-22 mins                    G Codes:          Rebeca Alert, MPT Pager: [phone]

## 2017-09-20 NOTE — Progress Notes (Signed)
Patient complaining of sharp, shooting pain behind left knee. Nurse assessed area. No swelling, heat, or pain with touch. Nurse gave patient muscle relaxer and paged Avel Peace. Patient explains sharp, shooting pain behind the knee feels like it did before she had surgery. Nurse will monitor patients pain level behind knee.

## 2017-09-20 NOTE — Progress Notes (Signed)
Physical Therapy Treatment Patient Details Name: Cynthia Dickson MRN: [id number] DOB: [birth year] Today's Date: 09/20/2017    History of Present Illness 56 yo female admitted with L TKA.     PT Comments    Pt is progressing with mobility. Pain better controlled this p.m. Issued HEP for pt to perform 2x/day until she begins OP PT. Explained importance of being diligent with HEP since her OP PT appt is not until next week. All education completed. Okay to d/c from PT standpoint-RN made aware.    Follow Up Recommendations  DC plan and follow up therapy as arranged by surgeon(OP PT)     Equipment Recommendations  Rolling walker with 5" wheels    Recommendations for Other Services OT consult     Precautions / Restrictions Precautions Precautions: Knee;Fall Required Braces or Orthoses: Knee Immobilizer - Left Knee Immobilizer - Left: Discontinue once straight leg raise with < 10 degree lag Restrictions Weight Bearing Restrictions: No    Mobility  Bed Mobility Overal bed mobility: Needs Assistance Bed Mobility: Supine to Sit;Sit to Supine     Supine to sit: Min guard;HOB elevated Sit to supine: Min assist;HOB elevated   General bed mobility comments: Assist for LEs.   Transfers Overall transfer level: Needs assistance Equipment used: Rolling walker (2 wheeled) Transfers: Sit to/from Stand Sit to Stand: Min guard         General transfer comment: close guard for safety. VCs hand/LE placement.   Ambulation/Gait Ambulation/Gait assistance: Min guard Ambulation Distance (Feet): 85 Feet Assistive device: Rolling walker (2 wheeled) Gait Pattern/deviations: Step-to pattern     General Gait Details: slow gait speed. VCs safety, technique, sequence. close guard for safety.    Stairs            Wheelchair Mobility    Modified Rankin (Stroke Patients Only)       Balance                                            Cognition  Arousal/Alertness: Awake/alert Behavior During Therapy: WFL for tasks assessed/performed Overall Cognitive Status: Within Functional Limits for tasks assessed                                        Exercises      General Comments        Pertinent Vitals/Pain Pain Assessment: 0-10 Pain Score: 7  Pain Location: L knee Pain Descriptors / Indicators: Aching;Sore Pain Intervention(s): Monitored during session;Repositioned;Ice applied    Home Living                      Prior Function            PT Goals (current goals can now be found in the care plan section) Progress towards PT goals: Progressing toward goals    Frequency    7X/week      PT Plan Current plan remains appropriate    Co-evaluation              AM-PAC PT "6 Clicks" Daily Activity  Outcome Measure  Difficulty turning over in bed (including adjusting bedclothes, sheets and blankets)?: Unable Difficulty moving from lying on back to sitting on the side of the bed? : Unable Difficulty sitting down on  and standing up from a chair with arms (e.g., wheelchair, bedside commode, etc,.)?: Unable Help needed moving to and from a bed to chair (including a wheelchair)?: A Little Help needed walking in hospital room?: A Little Help needed climbing 3-5 steps with a railing? : A Lot 6 Click Score: 11    End of Session Equipment Utilized During Treatment: Gait belt;Left knee immobilizer Activity Tolerance: Patient limited by pain Patient left: in bed;with call bell/phone within reach   PT Visit Diagnosis: Muscle weakness (generalized) (M62.81);Difficulty in walking, not elsewhere classified (R26.2)     Time: 2353-6144 PT Time Calculation (min) (ACUTE ONLY): 33 min  Charges:  $Gait Training: 23-37 mins                    G Codes:          Rebeca Alert, MPT Pager: [phone]

## 2017-12-15 DIAGNOSIS — Z96652 Presence of left artificial knee joint: Secondary | ICD-10-CM | POA: Insufficient documentation

## 2018-03-23 DIAGNOSIS — Z5189 Encounter for other specified aftercare: Secondary | ICD-10-CM | POA: Insufficient documentation

## 2018-09-06 NOTE — H&P (Signed)
TOTAL KNEE ADMISSION H&P  Patient is being admitted for right total knee arthroplasty.  Subjective:  Chief Complaint:right knee pain.  HPI: Cynthia Dickson, 57 y.o. female, has a history of pain and functional disability in the right knee due to arthritis and has failed non-surgical conservative treatments for greater than 12 weeks to includecorticosteriod injections, viscosupplementation injections and activity modification.  Onset of symptoms was gradual, starting several years ago with gradually worsening course since that time. The patient noted prior procedures on the knee to include  arthroscopy on the right knee(s).  Patient currently rates pain in the right knee(s) at 10 out of 10 with activity. Patient has worsening of pain with activity and weight bearing, joint swelling and instability.  Patient has evidence of bone-on-bone arthritis in the medial and patellofemoral compartments with slight varus deformity by imaging studies. There is no active infection.  Patient Active Problem List   Diagnosis Date Noted  . OA (osteoarthritis) of knee 09/18/2017   Past Medical History:  Diagnosis Date  . Asthma   . GERD (gastroesophageal reflux disease)   . Hypertension   . Sleep apnea     Past Surgical History:  Procedure Laterality Date  . ABDOMINAL HYSTERECTOMY    . CHOLECYSTECTOMY    . COLONOSCOPY    . KNEE ARTHROSCOPY    . ROTATOR CUFF REPAIR Right   . ROTATOR CUFF REPAIR Left   . TOTAL KNEE ARTHROPLASTY Left 09/18/2017   Procedure: LEFT TOTAL KNEE ARTHROPLASTY;  Surgeon: Ollen Gross, MD;  Location: [facility];  Service: Orthopedics;  Laterality: Left;    No current facility-administered medications for this encounter.    Current Outpatient Medications  Medication Sig Dispense Refill Last Dose  . benazepril-hydrochlorthiazide (LOTENSIN HCT) 20-12.5 MG tablet Take 1 tablet every other day by mouth.    Past Week at Unknown time  . furosemide (LASIX) 20 MG tablet Take 20 mg daily  as needed by mouth for fluid or edema.   0 Past Month at Unknown time  . gabapentin (NEURONTIN) 100 MG capsule Take 100 mg 2 (two) times daily as needed by mouth (pain).   2 09/18/2017 at 0600  . HYDROmorphone (DILAUDID) 2 MG tablet Take 1-2 tablets (2-4 mg total) by mouth every 4 (four) hours as needed for moderate pain or severe pain. 80 tablet 0   . methocarbamol (ROBAXIN) 500 MG tablet Take 1 tablet (500 mg total) by mouth every 6 (six) hours as needed for muscle spasms. 80 tablet 0   . rivaroxaban (XARELTO) 10 MG TABS tablet Take 1 tablet (10 mg total) by mouth daily with breakfast. Take Xarelto for two and a half more weeks following discharge from the hospital, then discontinue Xarelto. Once the patient has completed the blood thinner regimen, then take a Baby 81 mg Aspirin daily for three more weeks. 19 tablet 0   . traMADol (ULTRAM) 50 MG tablet Take 1-2 tablets (50-100 mg total) by mouth every 6 (six) hours as needed for moderate pain (if refractory to Dilaudid 2 mg PO). 56 tablet 0    Allergies  Allergen Reactions  . Codeine Shortness Of Breath and Itching  . Latex Itching and Swelling    Latex balloons cause lips to swell     Social History   Tobacco Use  . Smoking status: Never Smoker  . Smokeless tobacco: Never Used  Substance Use Topics  . Alcohol use: No    Frequency: Never    No family history on file.  Review of Systems  Constitutional: Negative for chills and fever.  HENT: Negative for congestion, sore throat and tinnitus.   Eyes: Negative for double vision, photophobia and pain.  Respiratory: Negative for cough, shortness of breath and wheezing.   Cardiovascular: Negative for chest pain, palpitations and orthopnea.  Gastrointestinal: Negative for heartburn, nausea and vomiting.  Genitourinary: Negative for dysuria, frequency and urgency.  Musculoskeletal: Positive for joint pain.  Neurological: Negative for dizziness, weakness and headaches.     Objective:  Physical Exam  Well nourished and well developed. General: Alert and oriented x3, cooperative and pleasant, no acute distress. Head: normocephalic, atraumatic, neck supple. Eyes: EOMI. Respiratory: breath sounds clear in all fields, no wheezing, rales, or rhonchi. Cardiovascular: Regular rate and rhythm, no murmurs, gallops or rubs.  Abdomen: non-tender to palpation and soft, normoactive bowel sounds. Musculoskeletal: Antalgic gait pattern favoring the right side without using assisted devices.  Right Knee Exam: No effusion. Varus deformity.Range of motion is 5-125 degrees. Marked crepitus on range of motion of the knee. Positive medial, greater than lateral, joint line tenderness.Stable knee. Left knee exam:No swelling. Range of motion: 0 - 125 degrees. Tiny AP laxity. No varus-valgus laxity. Calves soft and nontender. Motor function intact in LE. Strength 5/5 LE bilaterally. Neuro: Distal pulses 2+. Sensation to light touch intact in LE.  Vital signs in last 24 hours: Blood pressure: 156/90 mmHg Pulse: 76 bpm   Labs:   Estimated body mass index is 37.56 kg/m as calculated from the following:   Height as of 09/18/17: 5\' 8"  (1.727 m).   Weight as of 09/18/17: 112 kg.   Imaging Review Plain radiographs demonstrate severe degenerative joint disease of the right knee(s). The overall alignment issignificant varus. The bone quality appears to be adequate for age and reported activity level.   Preoperative templating of the joint replacement has been completed, documented, and submitted to the Operating Room personnel in order to optimize intra-operative equipment management.   Anticipated LOS equal to or greater than 2 midnights due to - Age 39 and older with one or more of the following:  - Obesity  - Expected need for hospital services (PT, OT, Nursing) required for safe  discharge  - Anticipated need for postoperative skilled nursing care or inpatient  rehab  - Active co-morbidities: None OR   - Unanticipated findings during/Post Surgery: None  - Patient is a high risk of re-admission due to: None     Assessment/Plan:  End stage arthritis, right knee   The patient history, physical examination, clinical judgment of the provider and imaging studies are consistent with end stage degenerative joint disease of the right knee(s) and total knee arthroplasty is deemed medically necessary. The treatment options including medical management, injection therapy arthroscopy and arthroplasty were discussed at length. The risks and benefits of total knee arthroplasty were presented and reviewed. The risks due to aseptic loosening, infection, stiffness, patella tracking problems, thromboembolic complications and other imponderables were discussed. The patient acknowledged the explanation, agreed to proceed with the plan and consent was signed. Patient is being admitted for inpatient treatment for surgery, pain control, PT, OT, prophylactic antibiotics, VTE prophylaxis, progressive ambulation and ADL's and discharge planning. The patient is planning to be discharged home with outpatient physical therapy.   Therapy Plans: outpatient therapy at [facility] in [place] Disposition: Home with husband Planned DVT Prophylaxis: Aspirin 325 mg BID DME needed: None PCP: Dr. Olena Leatherwood TXA: IV Allergies: Latex (rash), codeine (itching, anaphylaxis) Anesthesia Concerns: None BMI: 38 Other:  Post-operative pain management will be complicated by chronic opioid use  - Patient was instructed on what medications to stop prior to surgery. - Follow-up visit in 2 weeks with Dr. Lequita Halt - Begin physical therapy following surgery - Pre-operative lab work as pre-surgical testing - Prescriptions will be provided in hospital at time of discharge  Arther Abbott, PA-C Orthopedic Surgery [facility] [place]

## 2018-09-18 NOTE — Patient Instructions (Addendum)
Cynthia Dickson  Sep 13, 1961    Your procedure is scheduled on: 09-24-18   Report to [facility] Main  Entrance,  Report to admitting at   0900 AM    Call this number if you have problems the morning of surgery [phone]         Remember: Do not eat food or drink liquids :After Midnight.                                     BRUSH YOUR TEETH MORNING OF SURGERY AND RINSE YOUR MOUTH OUT, NO CHEWING GUM CANDY OR MINTS.         Take these medicines the morning of surgery with A SIP OF WATER: Hydrocodone if needed                                   You may not have any metal on your body including hair pins and piercings              Do not wear jewelry, make-up, lotions, powders or perfumes, deodorant              Do not wear nail polish.  Do not shave  48 hours prior to surgery.        Do not bring valuables to the hospital. [facility] IS NOT             RESPONSIBLE   FOR VALUABLES.  Contacts, dentures or bridgework may not be worn into surgery.  Leave suitcase in the car. After surgery it may be brought to your room.     _____________________________________________________________________           [facility] - Preparing for Surgery Before surgery, you can play an important role.  Because skin is not sterile, your skin needs to be as free of germs as possible.  You can reduce the number of germs on your skin by washing with CHG (chlorahexidine gluconate) soap before surgery.  CHG is an antiseptic cleaner which kills germs and bonds with the skin to continue killing germs even after washing. Please DO NOT use if you have an allergy to CHG or antibacterial soaps.  If your skin becomes reddened/irritated stop using the CHG and inform your nurse when you arrive at Short Stay. Do not shave (including legs and underarms) for at least 48 hours prior to the first CHG shower.  You may shave your face/neck. Please follow these instructions  carefully:  1.  Shower with CHG Soap the night before surgery and the  morning of Surgery.  2.  If you choose to wash your hair, wash your hair first as usual with your  normal  shampoo.  3.  After you shampoo, rinse your hair and body thoroughly to remove the  shampoo.                            4.  Use CHG as you would any other liquid soap.  You can apply chg directly  to the skin and wash                       Gently with a scrungie or  clean washcloth.  5.  Apply the CHG Soap to your body ONLY FROM THE NECK DOWN.   Do not use on face/ open                           Wound or open sores. Avoid contact with eyes, ears mouth and genitals (private parts).                       Wash face,  Genitals (private parts) with your normal soap.             6.  Wash thoroughly, paying special attention to the area where your surgery  will be performed.  7.  Thoroughly rinse your body with warm water from the neck down.  8.  DO NOT shower/wash with your normal soap after using and rinsing off  the CHG Soap.             9.  Pat yourself dry with a clean towel.            10.  Wear clean pajamas.            11.  Place clean sheets on your bed the night of your first shower and do not  sleep with pets. Day of Surgery : Do not apply any lotions/deodorants the morning of surgery.  Please wear clean clothes to the hospital/surgery center.  FAILURE TO FOLLOW THESE INSTRUCTIONS MAY RESULT IN THE CANCELLATION OF YOUR SURGERY PATIENT SIGNATURE_________________________________  NURSE SIGNATURE__________________________________  ________________________________________________________________________  WHAT IS A BLOOD TRANSFUSION? Blood Transfusion Information  A transfusion is the replacement of blood or some of its parts. Blood is made up of multiple cells which provide different functions.  Red blood cells carry oxygen and are used for blood loss replacement.  White blood cells fight against  infection.  Platelets control bleeding.  Plasma helps clot blood.  Other blood products are available for specialized needs, such as hemophilia or other clotting disorders. BEFORE THE TRANSFUSION  Who gives blood for transfusions?   Healthy volunteers who are fully evaluated to make sure their blood is safe. This is blood bank blood. Transfusion therapy is the safest it has ever been in the practice of medicine. Before blood is taken from a donor, a complete history is taken to make sure that person has no history of diseases nor engages in risky social behavior (examples are intravenous drug use or sexual activity with multiple partners). The donor's travel history is screened to minimize risk of transmitting infections, such as malaria. The donated blood is tested for signs of infectious diseases, such as HIV and hepatitis. The blood is then tested to be sure it is compatible with you in order to minimize the chance of a transfusion reaction. If you or a relative donates blood, this is often done in anticipation of surgery and is not appropriate for emergency situations. It takes many days to process the donated blood. RISKS AND COMPLICATIONS Although transfusion therapy is very safe and saves many lives, the main dangers of transfusion include:   Getting an infectious disease.  Developing a transfusion reaction. This is an allergic reaction to something in the blood you were given. Every precaution is taken to prevent this. The decision to have a blood transfusion has been considered carefully by your caregiver before blood is given. Blood is not given unless the benefits outweigh the risks. AFTER THE TRANSFUSION  Right after  receiving a blood transfusion, you will usually feel much better and more energetic. This is especially true if your red blood cells have gotten low (anemic). The transfusion raises the level of the red blood cells which carry oxygen, and this usually causes an energy  increase.  The nurse administering the transfusion will monitor you carefully for complications. HOME CARE INSTRUCTIONS  No special instructions are needed after a transfusion. You may find your energy is better. Speak with your caregiver about any limitations on activity for underlying diseases you may have. SEEK MEDICAL CARE IF:   Your condition is not improving after your transfusion.  You develop redness or irritation at the intravenous (IV) site. SEEK IMMEDIATE MEDICAL CARE IF:  Any of the following symptoms occur over the next 12 hours:  Shaking chills.  You have a temperature by mouth above 102 F (38.9 C), not controlled by medicine.  Chest, back, or muscle pain.  People around you feel you are not acting correctly or are confused.  Shortness of breath or difficulty breathing.  Dizziness and fainting.  You get a rash or develop hives.  You have a decrease in urine output.  Your urine turns a dark color or changes to pink, red, or brown. Any of the following symptoms occur over the next 10 days:  You have a temperature by mouth above 102 F (38.9 C), not controlled by medicine.  Shortness of breath.  Weakness after normal activity.  The white part of the eye turns yellow (jaundice).  You have a decrease in the amount of urine or are urinating less often.  Your urine turns a dark color or changes to pink, red, or brown. Document Released: 10/14/2000 Document Revised: 01/09/2012 Document Reviewed: 06/02/2008 ExitCare Patient Information 2014 [place].  _______________________________________________________________________  Incentive Spirometer  An incentive spirometer is a tool that can help keep your lungs clear and active. This tool measures how well you are filling your lungs with each breath. Taking long deep breaths may help reverse or decrease the chance of developing breathing (pulmonary) problems (especially infection) following:  A long  period of time when you are unable to move or be active. BEFORE THE PROCEDURE   If the spirometer includes an indicator to show your best effort, your nurse or respiratory therapist will set it to a desired goal.  If possible, sit up straight or lean slightly forward. Try not to slouch.  Hold the incentive spirometer in an upright position. INSTRUCTIONS FOR USE  1. Sit on the edge of your bed if possible, or sit up as far as you can in bed or on a chair. 2. Hold the incentive spirometer in an upright position. 3. Breathe out normally. 4. Place the mouthpiece in your mouth and seal your lips tightly around it. 5. Breathe in slowly and as deeply as possible, raising the piston or the ball toward the top of the column. 6. Hold your breath for 3-5 seconds or for as long as possible. Allow the piston or ball to fall to the bottom of the column. 7. Remove the mouthpiece from your mouth and breathe out normally. 8. Rest for a few seconds and repeat Steps 1 through 7 at least 10 times every 1-2 hours when you are awake. Take your time and take a few normal breaths between deep breaths. 9. The spirometer may include an indicator to show your best effort. Use the indicator as a goal to work toward during each repetition. 10. After each set  of 10 deep breaths, practice coughing to be sure your lungs are clear. If you have an incision (the cut made at the time of surgery), support your incision when coughing by placing a pillow or rolled up towels firmly against it. Once you are able to get out of bed, walk around indoors and cough well. You may stop using the incentive spirometer when instructed by your caregiver.  RISKS AND COMPLICATIONS  Take your time so you do not get dizzy or light-headed.  If you are in pain, you may need to take or ask for pain medication before doing incentive spirometry. It is harder to take a deep breath if you are having pain. AFTER USE  Rest and breathe slowly and  easily.  It can be helpful to keep track of a log of your progress. Your caregiver can provide you with a simple table to help with this. If you are using the spirometer at home, follow these instructions: [place] IF:   You are having difficultly using the spirometer.  You have trouble using the spirometer as often as instructed.  Your pain medication is not giving enough relief while using the spirometer.  You develop fever of 100.5 F (38.1 C) or higher. SEEK IMMEDIATE MEDICAL CARE IF:   You cough up bloody sputum that had not been present before.  You develop fever of 102 F (38.9 C) or greater.  You develop worsening pain at or near the incision site. MAKE SURE YOU:   Understand these instructions.  Will watch your condition.  Will get help right away if you are not doing well or get worse. Document Released: 02/27/2007 Document Revised: 01/09/2012 Document Reviewed: 04/30/2007 [facility] Patient Information 2014 [place], [place].   ________________________________________________________________________

## 2018-09-19 ENCOUNTER — Encounter (HOSPITAL_COMMUNITY): Payer: Self-pay

## 2018-09-19 ENCOUNTER — Encounter (HOSPITAL_COMMUNITY)
Admission: RE | Admit: 2018-09-19 | Discharge: 2018-09-19 | Disposition: A | Discharge status: Home / Self Care | Payer: PRIVATE HEALTH INSURANCE | Source: Ambulatory Visit | Attending: Orthopedic Surgery | Admitting: Orthopedic Surgery

## 2018-09-19 ENCOUNTER — Other Ambulatory Visit: Payer: Self-pay

## 2018-09-19 DIAGNOSIS — R001 Bradycardia, unspecified: Secondary | ICD-10-CM | POA: Insufficient documentation

## 2018-09-19 DIAGNOSIS — R9431 Abnormal electrocardiogram [ECG] [EKG]: Secondary | ICD-10-CM | POA: Diagnosis not present

## 2018-09-19 DIAGNOSIS — Z01818 Encounter for other preprocedural examination: Secondary | ICD-10-CM | POA: Diagnosis not present

## 2018-09-19 DIAGNOSIS — M1711 Unilateral primary osteoarthritis, right knee: Secondary | ICD-10-CM | POA: Diagnosis not present

## 2018-09-19 HISTORY — DX: Unspecified asthma, uncomplicated: J45.909

## 2018-09-19 HISTORY — DX: Obstructive sleep apnea (adult) (pediatric): G47.33

## 2018-09-19 HISTORY — DX: Paresthesia of skin: R20.2

## 2018-09-19 HISTORY — DX: Anesthesia of skin: R20.0

## 2018-09-19 LAB — COMPREHENSIVE METABOLIC PANEL
ALBUMIN: 4.1 g/dL (ref 3.5–5.0)
ALT: 22 U/L (ref 0–44)
AST: 23 U/L (ref 15–41)
Alkaline Phosphatase: 67 U/L (ref 38–126)
Anion gap: 6 (ref 5–15)
BILIRUBIN TOTAL: 0.6 mg/dL (ref 0.3–1.2)
BUN: 15 mg/dL (ref 6–20)
CO2: 26 mmol/L (ref 22–32)
CREATININE: 0.94 mg/dL (ref 0.44–1.00)
Calcium: 9 mg/dL (ref 8.9–10.3)
Chloride: 109 mmol/L (ref 98–111)
GFR calc Af Amer: >60 mL/min (ref >60)
GFR calc non Af Amer: >60 mL/min (ref >60)
GLUCOSE: 115 mg/dL — AB (ref 70–99)
Potassium: 4.3 mmol/L (ref 3.5–5.1)
SODIUM: 141 mmol/L (ref 135–145)
TOTAL PROTEIN: 7.5 g/dL (ref 6.5–8.1)

## 2018-09-19 LAB — CBC
HEMATOCRIT: 40.8 % (ref 36.0–46.0)
HEMOGLOBIN: 12.7 g/dL (ref 12.0–15.0)
MCH: 28.8 pg (ref 26.0–34.0)
MCHC: 31.1 g/dL (ref 30.0–36.0)
MCV: 92.5 fL (ref 80.0–100.0)
Platelets: 200 10*3/uL (ref 150–400)
RBC: 4.41 MIL/uL (ref 3.87–5.11)
RDW: 15 % (ref 11.5–15.5)
WBC: 4.4 10*3/uL (ref 4.0–10.5)
nRBC: 0 % (ref 0.0–0.2)

## 2018-09-19 LAB — PROTIME-INR
INR: 0.96
Prothrombin Time: 12.7 seconds (ref 11.4–15.2)

## 2018-09-19 LAB — SURGICAL PCR SCREEN
MRSA, PCR: NEGATIVE
Staphylococcus aureus: NEGATIVE

## 2018-09-19 LAB — APTT: APTT: 33 s (ref 24–36)

## 2018-09-19 NOTE — Progress Notes (Signed)
Final EKG dated 09-19-2018 in epic.

## 2018-09-23 MED ORDER — BUPIVACAINE LIPOSOME 1.3 % IJ SUSP
20.0000 mL | INTRAMUSCULAR | Status: DC
  Filled 2018-09-23: qty 20

## 2018-09-24 ENCOUNTER — Other Ambulatory Visit: Payer: Self-pay

## 2018-09-24 ENCOUNTER — Inpatient Hospital Stay (HOSPITAL_COMMUNITY)
Admission: RE | Admit: 2018-09-24 | Discharge: 2018-09-26 | DRG: 470 | Disposition: A | Discharge status: Home / Self Care | Payer: PRIVATE HEALTH INSURANCE | Source: Ambulatory Visit | Attending: Orthopedic Surgery | Admitting: Orthopedic Surgery

## 2018-09-24 ENCOUNTER — Inpatient Hospital Stay (HOSPITAL_COMMUNITY): Payer: PRIVATE HEALTH INSURANCE | Admitting: Certified Registered"

## 2018-09-24 ENCOUNTER — Encounter (HOSPITAL_COMMUNITY): Payer: Self-pay

## 2018-09-24 ENCOUNTER — Encounter (HOSPITAL_COMMUNITY)
Admission: RE | Disposition: A | Discharge status: Home / Self Care | Payer: Self-pay | Source: Ambulatory Visit | Attending: Orthopedic Surgery

## 2018-09-24 DIAGNOSIS — M1711 Unilateral primary osteoarthritis, right knee: Principal | ICD-10-CM | POA: Diagnosis present

## 2018-09-24 DIAGNOSIS — G4733 Obstructive sleep apnea (adult) (pediatric): Secondary | ICD-10-CM | POA: Diagnosis present

## 2018-09-24 DIAGNOSIS — Z6837 Body mass index (BMI) 37.0-37.9, adult: Secondary | ICD-10-CM

## 2018-09-24 DIAGNOSIS — J45909 Unspecified asthma, uncomplicated: Secondary | ICD-10-CM | POA: Diagnosis present

## 2018-09-24 DIAGNOSIS — Z79899 Other long term (current) drug therapy: Secondary | ICD-10-CM

## 2018-09-24 DIAGNOSIS — E669 Obesity, unspecified: Secondary | ICD-10-CM | POA: Diagnosis present

## 2018-09-24 DIAGNOSIS — M179 Osteoarthritis of knee, unspecified: Secondary | ICD-10-CM | POA: Diagnosis present

## 2018-09-24 DIAGNOSIS — Z885 Allergy status to narcotic agent status: Secondary | ICD-10-CM

## 2018-09-24 DIAGNOSIS — Z91040 Latex allergy status: Secondary | ICD-10-CM

## 2018-09-24 DIAGNOSIS — Z7901 Long term (current) use of anticoagulants: Secondary | ICD-10-CM

## 2018-09-24 DIAGNOSIS — K219 Gastro-esophageal reflux disease without esophagitis: Secondary | ICD-10-CM | POA: Diagnosis present

## 2018-09-24 DIAGNOSIS — M1710 Unilateral primary osteoarthritis, unspecified knee: Secondary | ICD-10-CM | POA: Diagnosis present

## 2018-09-24 DIAGNOSIS — I10 Essential (primary) hypertension: Secondary | ICD-10-CM | POA: Diagnosis present

## 2018-09-24 DIAGNOSIS — Z96652 Presence of left artificial knee joint: Secondary | ICD-10-CM | POA: Diagnosis present

## 2018-09-24 HISTORY — PX: TOTAL KNEE ARTHROPLASTY: SHX125

## 2018-09-24 LAB — TYPE AND SCREEN
ABO/RH(D): O POS
Antibody Screen: NEGATIVE

## 2018-09-24 SURGERY — ARTHROPLASTY, KNEE, TOTAL
Anesthesia: Spinal | Site: Knee | Laterality: Right

## 2018-09-24 MED ORDER — OXYCODONE HCL 5 MG PO TABS
10.0000 mg | ORAL_TABLET | ORAL | Status: DC | PRN
  Administered 2018-09-24 – 2018-09-26 (×8): 10 mg via ORAL
  Filled 2018-09-24 (×10): qty 2

## 2018-09-24 MED ORDER — DEXAMETHASONE SODIUM PHOSPHATE 10 MG/ML IJ SOLN
INTRAMUSCULAR | Status: AC
  Filled 2018-09-24: qty 1

## 2018-09-24 MED ORDER — FENTANYL CITRATE (PF) 100 MCG/2ML IJ SOLN
50.0000 ug | Freq: Once | INTRAMUSCULAR | Status: AC
  Administered 2018-09-24: 100 ug via INTRAVENOUS
  Filled 2018-09-24: qty 2

## 2018-09-24 MED ORDER — DEXAMETHASONE SODIUM PHOSPHATE 10 MG/ML IJ SOLN
8.0000 mg | Freq: Once | INTRAMUSCULAR | Status: AC
  Administered 2018-09-24: 8 mg via INTRAVENOUS

## 2018-09-24 MED ORDER — SODIUM CHLORIDE 0.9 % IV SOLN
INTRAVENOUS | Status: DC
  Administered 2018-09-24 – 2018-09-25 (×2): via INTRAVENOUS

## 2018-09-24 MED ORDER — HYDROMORPHONE HCL 1 MG/ML IJ SOLN
0.5000 mg | INTRAMUSCULAR | Status: DC | PRN
  Administered 2018-09-24: 1 mg via INTRAVENOUS
  Administered 2018-09-25: 0.5 mg via INTRAVENOUS
  Filled 2018-09-24 (×3): qty 1

## 2018-09-24 MED ORDER — POLYETHYLENE GLYCOL 3350 17 G PO PACK: 17.0000 g | PACK | Freq: Every day | ORAL | Status: DC | PRN

## 2018-09-24 MED ORDER — PHENYLEPHRINE HCL 10 MG/ML IJ SOLN
INTRAMUSCULAR | Status: AC
  Filled 2018-09-24: qty 2

## 2018-09-24 MED ORDER — CEFAZOLIN SODIUM-DEXTROSE 2-4 GM/100ML-% IV SOLN
2.0000 g | INTRAVENOUS | Status: AC
  Administered 2018-09-24: 2 g via INTRAVENOUS
  Filled 2018-09-24: qty 100

## 2018-09-24 MED ORDER — BUPIVACAINE IN DEXTROSE 0.75-8.25 % IT SOLN
INTRATHECAL | Status: DC | PRN
  Administered 2018-09-24: 1.6 mL via INTRATHECAL

## 2018-09-24 MED ORDER — LACTATED RINGERS IV SOLN
INTRAVENOUS | Status: DC
  Administered 2018-09-24: 09:00:00 via INTRAVENOUS

## 2018-09-24 MED ORDER — FUROSEMIDE 20 MG PO TABS: 20.0000 mg | ORAL_TABLET | ORAL | Status: DC

## 2018-09-24 MED ORDER — PHENOL 1.4 % MT LIQD: 1.0000 | OROMUCOSAL | Status: DC | PRN

## 2018-09-24 MED ORDER — ONDANSETRON HCL 4 MG/2ML IJ SOLN
INTRAMUSCULAR | Status: AC
  Filled 2018-09-24: qty 2

## 2018-09-24 MED ORDER — OXYCODONE HCL 5 MG/5ML PO SOLN
5.0000 mg | Freq: Once | ORAL | Status: DC | PRN
  Filled 2018-09-24: qty 5

## 2018-09-24 MED ORDER — TRANEXAMIC ACID-NACL 1000-0.7 MG/100ML-% IV SOLN
1000.0000 mg | INTRAVENOUS | Status: AC
  Administered 2018-09-24: 1000 mg via INTRAVENOUS
  Filled 2018-09-24: qty 100

## 2018-09-24 MED ORDER — MIDAZOLAM HCL 2 MG/2ML IJ SOLN
1.0000 mg | Freq: Once | INTRAMUSCULAR | Status: AC
  Administered 2018-09-24: 2 mg via INTRAVENOUS
  Filled 2018-09-24: qty 2

## 2018-09-24 MED ORDER — MENTHOL 3 MG MT LOZG: 1.0000 | LOZENGE | OROMUCOSAL | Status: DC | PRN

## 2018-09-24 MED ORDER — TRANEXAMIC ACID-NACL 1000-0.7 MG/100ML-% IV SOLN
1000.0000 mg | Freq: Once | INTRAVENOUS | Status: AC
  Administered 2018-09-24: 1000 mg via INTRAVENOUS
  Filled 2018-09-24: qty 100

## 2018-09-24 MED ORDER — 0.9 % SODIUM CHLORIDE (POUR BTL) OPTIME
TOPICAL | Status: DC | PRN
  Administered 2018-09-24: 1000 mL

## 2018-09-24 MED ORDER — ACETAMINOPHEN 500 MG PO TABS
1000.0000 mg | ORAL_TABLET | Freq: Four times a day (QID) | ORAL | Status: AC
  Administered 2018-09-24 – 2018-09-25 (×4): 1000 mg via ORAL
  Filled 2018-09-24 (×4): qty 2

## 2018-09-24 MED ORDER — GABAPENTIN 300 MG PO CAPS
300.0000 mg | ORAL_CAPSULE | Freq: Once | ORAL | Status: AC
  Administered 2018-09-24: 300 mg via ORAL
  Filled 2018-09-24: qty 1

## 2018-09-24 MED ORDER — SODIUM CHLORIDE (PF) 0.9 % IJ SOLN
INTRAMUSCULAR | Status: AC
  Filled 2018-09-24: qty 10

## 2018-09-24 MED ORDER — FLEET ENEMA 7-19 GM/118ML RE ENEM: 1.0000 | ENEMA | Freq: Once | RECTAL | Status: DC | PRN

## 2018-09-24 MED ORDER — TRAMADOL HCL 50 MG PO TABS
50.0000 mg | ORAL_TABLET | Freq: Four times a day (QID) | ORAL | Status: DC | PRN
  Administered 2018-09-24 – 2018-09-26 (×3): 100 mg via ORAL
  Filled 2018-09-24 (×4): qty 2

## 2018-09-24 MED ORDER — METHOCARBAMOL 500 MG IVPB - SIMPLE MED
500.0000 mg | Freq: Four times a day (QID) | INTRAVENOUS | Status: DC | PRN
  Filled 2018-09-24: qty 50

## 2018-09-24 MED ORDER — PROPOFOL 500 MG/50ML IV EMUL
INTRAVENOUS | Status: DC | PRN
  Administered 2018-09-24: 75 ug/kg/min via INTRAVENOUS
  Administered 2018-09-24: 100 ug/kg/min via INTRAVENOUS

## 2018-09-24 MED ORDER — PHENYLEPHRINE 40 MCG/ML (10ML) SYRINGE FOR IV PUSH (FOR BLOOD PRESSURE SUPPORT)
PREFILLED_SYRINGE | INTRAVENOUS | Status: DC | PRN
  Administered 2018-09-24: 80 ug via INTRAVENOUS
  Administered 2018-09-24: 120 ug via INTRAVENOUS

## 2018-09-24 MED ORDER — ONDANSETRON HCL 4 MG/2ML IJ SOLN
INTRAMUSCULAR | Status: DC | PRN
  Administered 2018-09-24: 4 mg via INTRAVENOUS

## 2018-09-24 MED ORDER — METOCLOPRAMIDE HCL 5 MG/ML IJ SOLN: 5.0000 mg | Freq: Three times a day (TID) | INTRAMUSCULAR | Status: DC | PRN

## 2018-09-24 MED ORDER — STERILE WATER FOR IRRIGATION IR SOLN
Status: DC | PRN
  Administered 2018-09-24: 2000 mL

## 2018-09-24 MED ORDER — DIPHENHYDRAMINE HCL 12.5 MG/5ML PO ELIX: 12.5000 mg | ORAL_SOLUTION | ORAL | Status: DC | PRN

## 2018-09-24 MED ORDER — SODIUM CHLORIDE (PF) 0.9 % IJ SOLN
INTRAMUSCULAR | Status: DC | PRN
  Administered 2018-09-24: 60 mL

## 2018-09-24 MED ORDER — PROPOFOL 10 MG/ML IV BOLUS
INTRAVENOUS | Status: DC | PRN
  Administered 2018-09-24: 50 mg via INTRAVENOUS

## 2018-09-24 MED ORDER — PROPOFOL 10 MG/ML IV BOLUS
INTRAVENOUS | Status: AC
  Filled 2018-09-24: qty 20

## 2018-09-24 MED ORDER — ONDANSETRON HCL 4 MG/2ML IJ SOLN: 4.0000 mg | Freq: Once | INTRAMUSCULAR | Status: DC | PRN

## 2018-09-24 MED ORDER — BISACODYL 10 MG RE SUPP: 10.0000 mg | Freq: Every day | RECTAL | Status: DC | PRN

## 2018-09-24 MED ORDER — CEFAZOLIN SODIUM-DEXTROSE 2-4 GM/100ML-% IV SOLN
2.0000 g | Freq: Four times a day (QID) | INTRAVENOUS | Status: AC
  Administered 2018-09-24 (×2): 2 g via INTRAVENOUS
  Filled 2018-09-24 (×2): qty 100

## 2018-09-24 MED ORDER — CHLORHEXIDINE GLUCONATE 4 % EX LIQD: 60.0000 mL | Freq: Once | CUTANEOUS | Status: DC

## 2018-09-24 MED ORDER — DOCUSATE SODIUM 100 MG PO CAPS
100.0000 mg | ORAL_CAPSULE | Freq: Two times a day (BID) | ORAL | Status: DC
  Administered 2018-09-24 – 2018-09-26 (×4): 100 mg via ORAL
  Filled 2018-09-24 (×4): qty 1

## 2018-09-24 MED ORDER — ASPIRIN EC 325 MG PO TBEC
325.0000 mg | DELAYED_RELEASE_TABLET | Freq: Two times a day (BID) | ORAL | Status: DC
  Administered 2018-09-25 – 2018-09-26 (×3): 325 mg via ORAL
  Filled 2018-09-24 (×3): qty 1

## 2018-09-24 MED ORDER — SODIUM CHLORIDE (PF) 0.9 % IJ SOLN
INTRAMUSCULAR | Status: AC
  Filled 2018-09-24: qty 50

## 2018-09-24 MED ORDER — IRBESARTAN 150 MG PO TABS
150.0000 mg | ORAL_TABLET | Freq: Every day | ORAL | Status: DC
  Administered 2018-09-26: 150 mg via ORAL
  Filled 2018-09-24: qty 1

## 2018-09-24 MED ORDER — BUPIVACAINE LIPOSOME 1.3 % IJ SUSP
INTRAMUSCULAR | Status: DC | PRN
  Administered 2018-09-24: 20 mL

## 2018-09-24 MED ORDER — GABAPENTIN 300 MG PO CAPS
300.0000 mg | ORAL_CAPSULE | Freq: Three times a day (TID) | ORAL | Status: DC
  Administered 2018-09-24 – 2018-09-26 (×5): 300 mg via ORAL
  Filled 2018-09-24 (×6): qty 1

## 2018-09-24 MED ORDER — ACETAMINOPHEN 10 MG/ML IV SOLN
1000.0000 mg | Freq: Four times a day (QID) | INTRAVENOUS | Status: DC
  Administered 2018-09-24: 1000 mg via INTRAVENOUS
  Filled 2018-09-24: qty 100

## 2018-09-24 MED ORDER — METHOCARBAMOL 500 MG PO TABS
500.0000 mg | ORAL_TABLET | Freq: Four times a day (QID) | ORAL | Status: DC | PRN
  Administered 2018-09-24 – 2018-09-26 (×6): 500 mg via ORAL
  Filled 2018-09-24 (×6): qty 1

## 2018-09-24 MED ORDER — OXYCODONE HCL 5 MG PO TABS: 5.0000 mg | ORAL_TABLET | Freq: Once | ORAL | Status: DC | PRN

## 2018-09-24 MED ORDER — FAMOTIDINE 20 MG PO TABS
40.0000 mg | ORAL_TABLET | Freq: Every day | ORAL | Status: DC
  Administered 2018-09-24 – 2018-09-25 (×2): 40 mg via ORAL
  Filled 2018-09-24 (×3): qty 2

## 2018-09-24 MED ORDER — ROPIVACAINE HCL 5 MG/ML IJ SOLN
INTRAMUSCULAR | Status: DC | PRN
  Administered 2018-09-24: 30 mL via PERINEURAL

## 2018-09-24 MED ORDER — METOCLOPRAMIDE HCL 5 MG PO TABS: 5.0000 mg | ORAL_TABLET | Freq: Three times a day (TID) | ORAL | Status: DC | PRN

## 2018-09-24 MED ORDER — OXYCODONE HCL 5 MG PO TABS
5.0000 mg | ORAL_TABLET | ORAL | Status: DC | PRN
  Administered 2018-09-24 – 2018-09-25 (×2): 5 mg via ORAL

## 2018-09-24 MED ORDER — DEXAMETHASONE SODIUM PHOSPHATE 10 MG/ML IJ SOLN
10.0000 mg | Freq: Once | INTRAMUSCULAR | Status: AC
  Administered 2018-09-25: 10 mg via INTRAVENOUS
  Filled 2018-09-24: qty 1

## 2018-09-24 MED ORDER — ONDANSETRON HCL 4 MG/2ML IJ SOLN
4.0000 mg | Freq: Four times a day (QID) | INTRAMUSCULAR | Status: DC | PRN
  Administered 2018-09-24 – 2018-09-25 (×2): 4 mg via INTRAVENOUS
  Filled 2018-09-24 (×2): qty 2

## 2018-09-24 MED ORDER — SODIUM CHLORIDE 0.9 % IR SOLN
Status: DC | PRN
  Administered 2018-09-24: 1000 mL

## 2018-09-24 MED ORDER — FENTANYL CITRATE (PF) 100 MCG/2ML IJ SOLN: 25.0000 ug | INTRAMUSCULAR | Status: DC | PRN

## 2018-09-24 MED ORDER — SODIUM CHLORIDE 0.9 % IV SOLN
INTRAVENOUS | Status: DC | PRN
  Administered 2018-09-24: 30 ug/min via INTRAVENOUS

## 2018-09-24 MED ORDER — ONDANSETRON HCL 4 MG PO TABS
4.0000 mg | ORAL_TABLET | Freq: Four times a day (QID) | ORAL | Status: DC | PRN
  Administered 2018-09-25: 4 mg via ORAL
  Filled 2018-09-24: qty 1

## 2018-09-24 SURGICAL SUPPLY — 56 items
ATTUNE PSFEM RTSZ6 NARCEM KNEE (Femur) ×3 IMPLANT
ATTUNE PSRP INSR SZ6 8 KNEE (Insert) ×2 IMPLANT
ATTUNE PSRP INSR SZ6 8MM KNEE (Insert) ×1 IMPLANT
BAG ZIPLOCK 12X15 (MISCELLANEOUS) ×3 IMPLANT
BANDAGE ACE 6X5 VEL STRL LF (GAUZE/BANDAGES/DRESSINGS) ×3 IMPLANT
BASE TIBIA ATTUNE KNEE SYS SZ6 (Knees) ×1 IMPLANT
BLADE SAG 18X100X1.27 (BLADE) ×3 IMPLANT
BLADE SAW SGTL 11.0X1.19X90.0M (BLADE) ×3 IMPLANT
BOWL SMART MIX CTS (DISPOSABLE) ×3 IMPLANT
CEMENT HV SMART SET (Cement) ×6 IMPLANT
CLOSURE WOUND 1/2 X4 (GAUZE/BANDAGES/DRESSINGS) ×2
COVER SURGICAL LIGHT HANDLE (MISCELLANEOUS) ×3 IMPLANT
COVER WAND RF STERILE (DRAPES) ×3 IMPLANT
CUFF TOURN SGL QUICK 34 (TOURNIQUET CUFF) ×2
CUFF TRNQT CYL 34X4X40X1 (TOURNIQUET CUFF) ×1 IMPLANT
DECANTER SPIKE VIAL GLASS SM (MISCELLANEOUS) ×3 IMPLANT
DRAPE U-SHAPE 47X51 STRL (DRAPES) ×3 IMPLANT
DRSG ADAPTIC 3X8 NADH LF (GAUZE/BANDAGES/DRESSINGS) ×3 IMPLANT
DURAPREP 26ML APPLICATOR (WOUND CARE) ×3 IMPLANT
ELECT REM PT RETURN 15FT ADLT (MISCELLANEOUS) ×3 IMPLANT
EVACUATOR 1/8 PVC DRAIN (DRAIN) ×3 IMPLANT
GAUZE SPONGE 4X4 12PLY STRL (GAUZE/BANDAGES/DRESSINGS) ×3 IMPLANT
GLOVE BIOGEL PI IND STRL 6.5 (GLOVE) ×3 IMPLANT
GLOVE BIOGEL PI IND STRL 7.0 (GLOVE) ×1 IMPLANT
GLOVE BIOGEL PI IND STRL 7.5 (GLOVE) ×2 IMPLANT
GLOVE BIOGEL PI IND STRL 8 (GLOVE) ×1 IMPLANT
GLOVE BIOGEL PI INDICATOR 6.5 (GLOVE) ×6
GLOVE BIOGEL PI INDICATOR 7.0 (GLOVE) ×2
GLOVE BIOGEL PI INDICATOR 7.5 (GLOVE) ×4
GLOVE BIOGEL PI INDICATOR 8 (GLOVE) ×2
GLOVE SURG SS PI 6.5 STRL IVOR (GLOVE) ×9 IMPLANT
GLOVE SURG SS PI 7.0 STRL IVOR (GLOVE) ×3 IMPLANT
GLOVE SURG SS PI 8.0 STRL IVOR (GLOVE) ×3 IMPLANT
GOWN STRL REUS W/TWL LRG LVL3 (GOWN DISPOSABLE) ×12 IMPLANT
GOWN STRL REUS W/TWL XL LVL3 (GOWN DISPOSABLE) ×6 IMPLANT
HANDPIECE INTERPULSE COAX TIP (DISPOSABLE) ×2
HOLDER FOLEY CATH W/STRAP (MISCELLANEOUS) ×3 IMPLANT
IMMOBILIZER KNEE 20 (SOFTGOODS) ×3
IMMOBILIZER KNEE 20 THIGH 36 (SOFTGOODS) ×1 IMPLANT
MANIFOLD NEPTUNE II (INSTRUMENTS) ×3 IMPLANT
PACK TOTAL KNEE CUSTOM (KITS) ×3 IMPLANT
PAD ABD 8X10 STRL (GAUZE/BANDAGES/DRESSINGS) ×3 IMPLANT
PADDING CAST COTTON 6X4 STRL (CAST SUPPLIES) ×9 IMPLANT
PATELLA MEDIAL ATTUN 35MM KNEE (Knees) ×3 IMPLANT
PIN STEINMAN FIXATION KNEE (PIN) ×3 IMPLANT
PROTECTOR NERVE ULNAR (MISCELLANEOUS) ×3 IMPLANT
SET HNDPC FAN SPRY TIP SCT (DISPOSABLE) ×1 IMPLANT
STRIP CLOSURE SKIN 1/2X4 (GAUZE/BANDAGES/DRESSINGS) ×4 IMPLANT
SUT MNCRL AB 4-0 PS2 18 (SUTURE) ×3 IMPLANT
SUT STRATAFIX 0 PDS 27 VIOLET (SUTURE) ×3
SUT VIC AB 2-0 CT1 27 (SUTURE) ×6
SUT VIC AB 2-0 CT1 TAPERPNT 27 (SUTURE) ×3 IMPLANT
SUTURE STRATFX 0 PDS 27 VIOLET (SUTURE) ×1 IMPLANT
TIBIA ATTUNE KNEE SYS BASE SZ6 (Knees) ×3 IMPLANT
WRAP KNEE MAXI GEL POST OP (GAUZE/BANDAGES/DRESSINGS) ×3 IMPLANT
YANKAUER SUCT BULB TIP 10FT TU (MISCELLANEOUS) ×3 IMPLANT

## 2018-09-24 NOTE — Op Note (Signed)
OPERATIVE REPORT-TOTAL KNEE ARTHROPLASTY   Pre-operative diagnosis- Osteoarthritis  Right knee(s)  Post-operative diagnosis- Osteoarthritis Right knee(s)  Procedure-  Right  Total Knee Arthroplasty (Depuy Attune)  Surgeon- Gus Rankin. Charlen Bakula, MD  Assistant- Arther Abbott, PA-C   Anesthesia-  Adductor canal block and spinal  EBL-50 mL   Drains Hemovac  Tourniquet time-  Total Tourniquet Time Documented: Thigh (Right) - 51 minutes Total: Thigh (Right) - 51 minutes     Complications- None  Condition-PACU - hemodynamically stable.   Brief Clinical Note  Cynthia FAUGHT is a 57 y.o. year old female with end stage OA of her right knee with progressively worsening pain and dysfunction. She has constant pain, with activity and at rest and significant functional deficits with difficulties even with ADLs. She has had extensive non-op management including analgesics, injections of cortisone and viscosupplements, and home exercise program, but remains in significant pain with significant dysfunction.Radiographs show bone on bone arthritis medial and patellofemoral. She presents now for right Total Knee Arthroplasty.    Procedure in detail---   The patient is brought into the operating room and positioned supine on the operating table. After successful administration of  Adductor canal block and spinal,   a tourniquet is placed high on the  Right thigh(s) and the lower extremity is prepped and draped in the usual sterile fashion. Time out is performed by the operating team and then the  Right lower extremity is wrapped in Esmarch, knee flexed and the tourniquet inflated to 300 mmHg.       A midline incision is made with a ten blade through the subcutaneous tissue to the level of the extensor mechanism. A fresh blade is used to make a medial parapatellar arthrotomy. Soft tissue over the proximal medial tibia is subperiosteally elevated to the joint line with a knife and into the  semimembranosus bursa with a Cobb elevator. Soft tissue over the proximal lateral tibia is elevated with attention being paid to avoiding the patellar tendon on the tibial tubercle. The patella is everted, knee flexed 90 degrees and the ACL and PCL are removed. Findings are bone on bone medial and patellofemoral with large global osteophytes.        The drill is used to create a starting hole in the distal femur and the canal is thoroughly irrigated with sterile saline to remove the fatty contents. The 5 degree Right  valgus alignment guide is placed into the femoral canal and the distal femoral cutting block is pinned to remove 9 mm off the distal femur. Resection is made with an oscillating saw.      The tibia is subluxed forward and the menisci are removed. The extramedullary alignment guide is placed referencing proximally at the medial aspect of the tibial tubercle and distally along the second metatarsal axis and tibial crest. The block is pinned to remove 8mm off the more deficient medial  side. Resection is made with an oscillating saw. Size 6is the most appropriate size for the tibia and the proximal tibia is prepared with the modular drill and keel punch for that size.      The femoral sizing guide is placed and size 6 is most appropriate. Rotation is marked off the epicondylar axis and confirmed by creating a rectangular flexion gap at 90 degrees. The size 6 cutting block is pinned in this rotation and the anterior, posterior and chamfer cuts are made with the oscillating saw. The intercondylar block is then placed and that cut is made.  Trial size 6 tibial component, trial size 6 narrow posterior stabilized femur and a 8  mm posterior stabilized rotating platform insert trial is placed. Full extension is achieved with excellent varus/valgus and anterior/posterior balance throughout full range of motion. The patella is everted and thickness measured to be 24  mm. Free hand resection is taken to 14  mm, a 35 template is placed, lug holes are drilled, trial patella is placed, and it tracks normally. Osteophytes are removed off the posterior femur with the trial in place. All trials are removed and the cut bone surfaces prepared with pulsatile lavage. Cement is mixed and once ready for implantation, the size 6 tibial implant, size  6 narrow posterior stabilized femoral component, and the size 35 patella are cemented in place and the patella is held with the clamp. The trial insert is placed and the knee held in full extension. The Exparel (20 ml mixed with 60 ml saline) is injected into the extensor mechanism, posterior capsule, medial and lateral gutters and subcutaneous tissues.  All extruded cement is removed and once the cement is hard the permanent 8 mm posterior stabilized rotating platform insert is placed into the tibial tray.      The wound is copiously irrigated with saline solution and the extensor mechanism closed over a hemovac drain with #1 V-loc suture. The tourniquet is released for a total tourniquet time of 51  minutes. Flexion against gravity is 140 degrees and the patella tracks normally. Subcutaneous tissue is closed with 2.0 vicryl and subcuticular with running 4.0 Monocryl. The incision is cleaned and dried and steri-strips and a bulky sterile dressing are applied. The limb is placed into a knee immobilizer and the patient is awakened and transported to recovery in stable condition.      Please note that a surgical assistant was a medical necessity for this procedure in order to perform it in a safe and expeditious manner. Surgical assistant was necessary to retract the ligaments and vital neurovascular structures to prevent injury to them and also necessary for proper positioning of the limb to allow for anatomic placement of the prosthesis.   Gus Rankin Garion Wempe, MD    09/24/2018, 12:48 PM

## 2018-09-24 NOTE — Interval H&P Note (Signed)
History and Physical Interval Note:  09/24/2018 9:17 AM  Cynthia Dickson  has presented today for surgery, with the diagnosis of right knee osteoarthritis  The various methods of treatment have been discussed with the patient and family. After consideration of risks, benefits and other options for treatment, the patient has consented to  Procedure(s) with comments: RIGHT TOTAL KNEE ARTHROPLASTY (Right) - as a surgical intervention .  The patient's history has been reviewed, patient examined, no change in status, stable for surgery.  I have reviewed the patient's chart and labs.  Questions were answered to the patient's satisfaction.     Homero Fellers Joeleen Wortley

## 2018-09-24 NOTE — Progress Notes (Signed)
Assisted Dr. Clemens Catholic with right, ultrasound guided, adductor canal block. Side rails up, monitors on throughout procedure. See vital signs in flow sheet. Tolerated Procedure well.

## 2018-09-24 NOTE — Transfer of Care (Signed)
Immediate Anesthesia Transfer of Care Note  Patient: Cynthia Dickson  Procedure(s) Performed: RIGHT TOTAL KNEE ARTHROPLASTY (Right Knee)  Patient Location: PACU  Anesthesia Type:Spinal  Level of Consciousness: awake, alert  and oriented  Airway & Oxygen Therapy: Patient Spontanous Breathing and Patient connected to face mask oxygen  Post-op Assessment: Report given to RN and Post -op Vital signs reviewed and stable  Post vital signs: Reviewed and stable  Last Vitals:  Vitals Value Taken Time  BP 131/108 09/24/2018  1:19 PM  Temp    Pulse 71 09/24/2018  1:21 PM  Resp 16 09/24/2018  1:21 PM  SpO2 99 % 09/24/2018  1:21 PM  Vitals shown include unvalidated device data.  Last Pain:  Vitals:   09/24/18 0908  TempSrc:   PainSc: 0-No pain         Complications: No apparent anesthesia complications

## 2018-09-24 NOTE — Anesthesia Procedure Notes (Signed)
Anesthesia Regional Block: Adductor canal block   Pre-Anesthetic Checklist: ,, timeout performed, Correct Patient, Correct Site, Correct Laterality, Correct Procedure, Correct Position, site marked, Risks and benefits discussed,  Surgical consent,  Pre-op evaluation,  At surgeon's request and post-op pain management  Laterality: Right  Prep: chloraprep       Needles:  Injection technique: Single-shot  Needle Type: Echogenic Stimulator Needle     Needle Length: 9cm  Needle Gauge: 21     Additional Needles:   Procedures:,,,, ultrasound used (permanent image in chart),,,,  Narrative:  Start time: 09/24/2018 11:00 AM End time: 09/24/2018 11:04 AM Injection made incrementally with aspirations every 5 mL.  Performed by: Personally  Anesthesiologist: [name], MD  Additional Notes: Monitors applied. Injection made in 5cc increments. No resistance to injection. Good needle visualization. Patient tolerated procedure well.

## 2018-09-24 NOTE — Progress Notes (Signed)
Pt refused CPAP for QHS. Pt states she doe not ware one at home.

## 2018-09-24 NOTE — Anesthesia Preprocedure Evaluation (Signed)
Anesthesia Evaluation  Patient identified by MRN, date of birth, ID band Patient awake    Reviewed: Allergy & Precautions, NPO status , Patient's Chart, lab work & pertinent test results  History of Anesthesia Complications Negative for: history of anesthetic complications  Airway Mallampati: III  TM Distance: >3 FB Neck ROM: Full    Dental no notable dental hx. (+) Lower Dentures, Upper Dentures   Pulmonary asthma , sleep apnea ,    Pulmonary exam normal        Cardiovascular hypertension, Pt. on medications Normal cardiovascular exam     Neuro/Psych negative neurological ROS  negative psych ROS   GI/Hepatic Neg liver ROS, GERD  Medicated,  Endo/Other  negative endocrine ROS  Renal/GU negative Renal ROS  negative genitourinary   Musculoskeletal  (+) Arthritis , Osteoarthritis,    Abdominal   Peds  Hematology negative hematology ROS (+)   Anesthesia Other Findings   Reproductive/Obstetrics                            Anesthesia Physical Anesthesia Plan  ASA: III  Anesthesia Plan: Spinal   Post-op Pain Management:    Induction:   PONV Risk Score and Plan: 2 and Propofol infusion, TIVA and Treatment may vary due to age or medical condition  Airway Management Planned: Natural Airway, Nasal Cannula and Simple Face Mask  Additional Equipment: None  Intra-op Plan:   Post-operative Plan:   Informed Consent: I have reviewed the patients History and Physical, chart, labs and discussed the procedure including the risks, benefits and alternatives for the proposed anesthesia with the patient or authorized representative who has indicated his/her understanding and acceptance.     Plan Discussed with:   Anesthesia Plan Comments:         Anesthesia Quick Evaluation

## 2018-09-24 NOTE — Anesthesia Procedure Notes (Signed)
Spinal  Patient location during procedure: OR Start time: 09/24/2018 11:24 AM End time: 09/24/2018 11:28 AM Staffing Resident/CRNA: Nelle Don, CRNA Performed: resident/CRNA  Preanesthetic Checklist Completed: patient identified, surgical consent, pre-op evaluation, IV checked, risks and benefits discussed and monitors and equipment checked Spinal Block Patient position: sitting Prep: DuraPrep Patient monitoring: heart rate, continuous pulse ox and blood pressure Approach: midline Location: L2-3 Injection technique: single-shot Needle Needle type: Pencan  Needle gauge: 24 G Needle length: 5 cm Assessment Sensory level: T6

## 2018-09-24 NOTE — Anesthesia Postprocedure Evaluation (Signed)
Anesthesia Post Note  Patient: Cynthia Dickson [facility]  Procedure(s) Performed: RIGHT TOTAL KNEE ARTHROPLASTY (Right Knee)     Patient location during evaluation: PACU Anesthesia Type: Spinal Level of consciousness: oriented and awake and alert Pain management: pain level controlled Vital Signs Assessment: post-procedure vital signs reviewed and stable Respiratory status: spontaneous breathing, respiratory function stable, patient connected to nasal cannula oxygen and nonlabored ventilation Cardiovascular status: blood pressure returned to baseline and stable Postop Assessment: no headache, no backache, no apparent nausea or vomiting and spinal receding Anesthetic complications: no    Last Vitals:  Vitals:   09/24/18 1635 09/24/18 1816  BP: (!) 153/72 (!) 155/88  Pulse: 69 72  Resp: 16 16  Temp: 36.5 C 36.6 C  SpO2: 100% 98%    Last Pain:  Vitals:   09/24/18 1823  TempSrc:   PainSc: 7                  [name]

## 2018-09-24 NOTE — Discharge Instructions (Signed)
° °Dr. Frank Aluisio °Total Joint Specialist °[facility] °[address] °[place], [place] [address] °[phone] ° °TOTAL KNEE REPLACEMENT POSTOPERATIVE DIRECTIONS ° °Knee Rehabilitation, Guidelines Following Surgery  °Results after knee surgery are often greatly improved when you follow the exercise, range of motion and muscle strengthening exercises prescribed by your doctor. Safety measures are also important to protect the knee from further injury. Any time any of these exercises cause you to have increased pain or swelling in your knee joint, decrease the amount until you are comfortable again and slowly increase them. If you have problems or questions, call your caregiver or physical therapist for advice.  ° °HOME CARE INSTRUCTIONS  °• Remove items at home which could result in a fall. This includes throw rugs or furniture in walking pathways.  °· ICE to the affected knee every three hours for 30 minutes at a time and then as needed for pain and swelling.  Continue to use ice on the knee for pain and swelling from surgery. You may notice swelling that will progress down to the foot and ankle.  This is normal after surgery.  Elevate the leg when you are not up walking on it.   °· Continue to use the breathing machine which will help keep your temperature down.  It is common for your temperature to cycle up and down following surgery, especially at night when you are not up moving around and exerting yourself.  The breathing machine keeps your lungs expanded and your temperature down. °· Do not place pillow under knee, focus on keeping the knee straight while resting ° °DIET °You may resume your previous home diet once your are discharged from the hospital. ° °DRESSING / WOUND CARE / SHOWERING °You may shower 3 days after surgery, but keep the wounds dry during showering.  You may use an occlusive plastic wrap (Press'n Seal for example), NO SOAKING/SUBMERGING IN THE BATHTUB.  If the bandage  gets wet, change with a clean dry gauze.  If the incision gets wet, pat the wound dry with a clean towel. °You may start showering once you are discharged home but do not submerge the incision under water. Just pat the incision dry and apply a dry gauze dressing on daily. °Change the surgical dressing daily and reapply a dry dressing each time. ° °ACTIVITY °Walk with your walker as instructed. °Use walker as long as suggested by your caregivers. °Avoid periods of inactivity such as sitting longer than an hour when not asleep. This helps prevent blood clots.  °You may resume a sexual relationship in one month or when given the OK by your doctor.  °You may return to work once you are cleared by your doctor.  °Do not drive a car for 6 weeks or until released by you surgeon.  °Do not drive while taking narcotics. ° °WEIGHT BEARING °Weight bearing as tolerated with assist device (walker, cane, etc) as directed, use it as long as suggested by your surgeon or therapist, typically at least 4-6 weeks. ° °POSTOPERATIVE CONSTIPATION PROTOCOL °Constipation - defined medically as fewer than three stools per week and severe constipation as less than one stool per week. ° °One of the most common issues patients have following surgery is constipation.  Even if you have a regular bowel pattern at home, your normal regimen is likely to be disrupted due to multiple reasons following surgery.  Combination of anesthesia, postoperative narcotics, change in appetite and fluid intake all can affect your bowels.    In order to avoid complications following surgery, here are some recommendations in order to help you during your recovery period. ° °Colace (docusate) - Pick up an over-the-counter form of Colace or another stool softener and take twice a day as long as you are requiring postoperative pain medications.  Take with a full glass of water daily.  If you experience loose stools or diarrhea, hold the colace until you stool forms back  up.  If your symptoms do not get better within 1 week or if they get worse, check with your doctor. ° °Dulcolax (bisacodyl) - Pick up over-the-counter and take as directed by the product packaging as needed to assist with the movement of your bowels.  Take with a full glass of water.  Use this product as needed if not relieved by Colace only.  ° °MiraLax (polyethylene glycol) - Pick up over-the-counter to have on hand.  MiraLax is a solution that will increase the amount of water in your bowels to assist with bowel movements.  Take as directed and can mix with a glass of water, juice, soda, coffee, or tea.  Take if you go more than two days without a movement. °Do not use MiraLax more than once per day. Call your doctor if you are still constipated or irregular after using this medication for 7 days in a row. ° °If you continue to have problems with postoperative constipation, please contact the office for further assistance and recommendations.  If you experience "the worst abdominal pain ever" or develop nausea or vomiting, please contact the office immediatly for further recommendations for treatment. ° °ITCHING ° If you experience itching with your medications, try taking only a single pain pill, or even half a pain pill at a time.  You can also use Benadryl over the counter for itching or also to help with sleep.  ° °TED HOSE STOCKINGS °Wear the elastic stockings on both legs for three weeks following surgery during the day but you may remove then at night for sleeping. ° °MEDICATIONS °See your medication summary on the “After Visit Summary” that the nursing staff will review with you prior to discharge.  You may have some home medications which will be placed on hold until you complete the course of blood thinner medication.  It is important for you to complete the blood thinner medication as prescribed by your surgeon.  Continue your approved medications as instructed at time of discharge. ° °PRECAUTIONS °If  you experience chest pain or shortness of breath - call 911 immediately for transfer to the hospital emergency department.  °If you develop a fever greater that 101 F, purulent drainage from wound, increased redness or drainage from wound, foul odor from the wound/dressing, or calf pain - CONTACT YOUR SURGEON.   °                                                °FOLLOW-UP APPOINTMENTS °Make sure you keep all of your appointments after your operation with your surgeon and caregivers. You should call the office at the above phone number and make an appointment for approximately two weeks after the date of your surgery or on the date instructed by your surgeon outlined in the "After Visit Summary". ° ° °RANGE OF MOTION AND STRENGTHENING EXERCISES  °Rehabilitation of the knee is important following a knee injury or   an operation. After just a few days of immobilization, the muscles of the thigh which control the knee become weakened and shrink (atrophy). Knee exercises are designed to build up the tone and strength of the thigh muscles and to improve knee motion. Often times heat used for twenty to thirty minutes before working out will loosen up your tissues and help with improving the range of motion but do not use heat for the first two weeks following surgery. These exercises can be done on a training (exercise) mat, on the floor, on a table or on a bed. Use what ever works the best and is most comfortable for you Knee exercises include:  °• Leg Lifts - While your knee is still immobilized in a splint or cast, you can do straight leg raises. Lift the leg to 60 degrees, hold for 3 sec, and slowly lower the leg. Repeat 10-20 times 2-3 times daily. Perform this exercise against resistance later as your knee gets better.  °• Quad and Hamstring Sets - Tighten up the muscle on the front of the thigh (Quad) and hold for 5-10 sec. Repeat this 10-20 times hourly. Hamstring sets are done by pushing the foot backward against an  object and holding for 5-10 sec. Repeat as with quad sets.  °· Leg Slides: Lying on your back, slowly slide your foot toward your buttocks, bending your knee up off the floor (only go as far as is comfortable). Then slowly slide your foot back down until your leg is flat on the floor again. °· Angel Wings: Lying on your back spread your legs to the side as far apart as you can without causing discomfort.  °A rehabilitation program following serious knee injuries can speed recovery and prevent re-injury in the future due to weakened muscles. Contact your doctor or a physical therapist for more information on knee rehabilitation.  ° °IF YOU ARE TRANSFERRED TO A SKILLED REHAB FACILITY °If the patient is transferred to a skilled rehab facility following release from the hospital, a list of the current medications will be sent to the facility for the patient to continue.  When discharged from the skilled rehab facility, please have the facility set up the patient's Home Health Physical Therapy prior to being released. Also, the skilled facility will be responsible for providing the patient with their medications at time of release from the facility to include their pain medication, the muscle relaxants, and their blood thinner medication. If the patient is still at the rehab facility at time of the two week follow up appointment, the skilled rehab facility will also need to assist the patient in arranging follow up appointment in our office and any transportation needs. ° °MAKE SURE YOU:  °• Understand these instructions.  °• Get help right away if you are not doing well or get worse.  ° ° °Pick up stool softner and laxative for home use following surgery while on pain medications. °Do not submerge incision under water. °Please use good hand washing techniques while changing dressing each day. °May shower starting three days after surgery. °Please use a clean towel to pat the incision dry following showers. °Continue to  use ice for pain and swelling after surgery. °Do not use any lotions or creams on the incision until instructed by your surgeon. ° °

## 2018-09-24 NOTE — Evaluation (Signed)
Physical Therapy Evaluation Patient Details Name: Cynthia Dickson MRN: [id number] DOB: 05-16-1961 Today's Date: 09/24/2018   History of Present Illness  57 yo female s/p R TKR on 09/24/18. PMH includes OSA, HTN, GERD, L TKR.   Clinical Impression  Pt presents with R knee pain, decreased R knee ROM, difficulty performing bed mobility and transfers, and decreased tolerance for ambulation due to pain. Pt very limited this session by pain, RN notified pt requesting more pain medications. Pt to benefit from acute PT to address deficits. Pt ambulated 6 ft with min guard assist with RW. PT to progress mobility as tolerated, and will continue to follow acutely.      Follow Up Recommendations Follow surgeon's recommendation for DC plan and follow-up therapies;Supervision for mobility/OOB(OPPT)    Equipment Recommendations  None recommended by PT    Recommendations for Other Services       Precautions / Restrictions Precautions Precautions: Fall Required Braces or Orthoses: Knee Immobilizer - Right Knee Immobilizer - Right: On when out of bed or walking;Discontinue once straight leg raise with < 10 degree lag Restrictions Weight Bearing Restrictions: No Other Position/Activity Restrictions: WBAT       Mobility  Bed Mobility Overal bed mobility: Needs Assistance Bed Mobility: Supine to Sit     Supine to sit: Min assist;HOB elevated     General bed mobility comments: Min assist for RLE management. Increased time and effort to perform.   Transfers Overall transfer level: Needs assistance   Transfers: Sit to/from Stand Sit to Stand: Min assist;From elevated surface         General transfer comment: Min assist for steadying upon standing, increased time to come to full erect posture.   Ambulation/Gait Ambulation/Gait assistance: Min guard Gait Distance (Feet): 6 Feet Assistive device: Rolling walker (2 wheeled) Gait Pattern/deviations: Step-to pattern;Decreased stride  length;Decreased weight shift to right;Decreased stance time - right;Antalgic;Trunk flexed Gait velocity: decr    General Gait Details: Pt with very antalgic gait, difficulty with sequencing LEs and RW. Verbal cuing for sequencing, placement in RW. Pt with increased pain after 6 ft ambulation, encouraged to sit in recliner that was following behind. Pt reporting feeling hot and dizzy, cool cloth applied to pt's brow.   Stairs            Wheelchair Mobility    Modified Rankin (Stroke Patients Only)       Balance Overall balance assessment: Mild deficits observed, not formally tested                                           Pertinent Vitals/Pain Pain Assessment: 0-10 Pain Score: 7  Pain Location: R knee  Pain Descriptors / Indicators: Aching;Pounding;Sharp Pain Intervention(s): Limited activity within patient's tolerance;Repositioned;Ice applied;Monitored during session;Patient requesting pain meds-RN notified    Home Living Family/patient expects to be discharged to:: Private residence Living Arrangements: Spouse/significant other Available Help at Discharge: Family;Available PRN/intermittently Type of Home: House Home Access: Level entry     Home Layout: Two level;Able to live on main level with bedroom/bathroom Home Equipment: Toilet riser;Walker - 2 wheels;Cane - single point      Prior Function Level of Independence: Independent               Hand Dominance   Dominant Hand: Right    Extremity/Trunk Assessment   Upper Extremity Assessment Upper Extremity Assessment: Overall [facility]  for tasks assessed    Lower Extremity Assessment Lower Extremity Assessment: RLE deficits/detail RLE Deficits / Details: suspected post-surgical weakness; able to perform ankle pumps, weak quad set, SLR with lift assist RLE Sensation: WNL    Cervical / Trunk Assessment Cervical / Trunk Assessment: Normal  Communication   Communication: No difficulties   Cognition Arousal/Alertness: Awake/alert Behavior During Therapy: WFL for tasks assessed/performed Overall Cognitive Status: Within Functional Limits for tasks assessed                                        General Comments      Exercises     Assessment/Plan    PT Assessment Patient needs continued PT services  PT Problem List Decreased strength;Pain;Decreased range of motion;Decreased knowledge of use of DME;Decreased activity tolerance;Decreased balance;Decreased safety awareness;Decreased mobility       PT Treatment Interventions DME instruction;Therapeutic activities;Gait training;Therapeutic exercise;Patient/family education;Balance training;Functional mobility training    PT Goals (Current goals can be found in the Care Plan section)  Acute Rehab PT Goals Patient Stated Goal: none stated  PT Goal Formulation: With patient Time For Goal Achievement: 10/01/18 Potential to Achieve Goals: Good    Frequency 7X/week   Barriers to discharge        Co-evaluation               AM-PAC PT "6 Clicks" Mobility  Outcome Measure Help needed turning from your back to your side while in a flat bed without using bedrails?: A Little Help needed moving from lying on your back to sitting on the side of a flat bed without using bedrails?: A Lot Help needed moving to and from a bed to a chair (including a wheelchair)?: A Little Help needed standing up from a chair using your arms (e.g., wheelchair or bedside chair)?: A Little Help needed to walk in hospital room?: A Little Help needed climbing 3-5 steps with a railing? : A Little 6 Click Score: 17    End of Session Equipment Utilized During Treatment: Gait belt;Right knee immobilizer Activity Tolerance: Patient limited by pain Patient left: in chair;with call bell/phone within reach;with family/visitor present Nurse Communication: Mobility status;Patient requests pain meds PT Visit Diagnosis: Other  abnormalities of gait and mobility (R26.89);Difficulty in walking, not elsewhere classified (R26.2)    Time: 2831-5176 PT Time Calculation (min) (ACUTE ONLY): 35 min   Charges:   PT Evaluation $PT Eval Low Complexity: 1 Low PT Treatments $Gait Training: 8-22 mins       Nicola Police, PT Acute Rehabilitation Services Pager [phone]  Office [phone]   Dejai Schubach D Despina Hidden 09/24/2018, 7:55 PM

## 2018-09-24 NOTE — Anesthesia Procedure Notes (Signed)
Date/Time: 09/24/2018 11:25 AM Performed by: Nelle Don, CRNA Pre-anesthesia Checklist: Patient identified, Emergency Drugs available, Suction available and Patient being monitored Oxygen Delivery Method: Simple face mask

## 2018-09-25 LAB — BASIC METABOLIC PANEL
Anion gap: 8 (ref 5–15)
BUN: 18 mg/dL (ref 6–20)
CHLORIDE: 107 mmol/L (ref 98–111)
CO2: 24 mmol/L (ref 22–32)
CREATININE: 1.1 mg/dL — AB (ref 0.44–1.00)
Calcium: 8.6 mg/dL — ABNORMAL LOW (ref 8.9–10.3)
GFR calc Af Amer: >60 mL/min (ref >60)
GFR calc non Af Amer: 54 mL/min — ABNORMAL LOW (ref >60)
Glucose, Bld: 183 mg/dL — ABNORMAL HIGH (ref 70–99)
POTASSIUM: 4.2 mmol/L (ref 3.5–5.1)
Sodium: 139 mmol/L (ref 135–145)

## 2018-09-25 LAB — CBC
HEMATOCRIT: 37.8 % (ref 36.0–46.0)
HEMOGLOBIN: 11.7 g/dL — AB (ref 12.0–15.0)
MCH: 28.4 pg (ref 26.0–34.0)
MCHC: 31 g/dL (ref 30.0–36.0)
MCV: 91.7 fL (ref 80.0–100.0)
Platelets: 226 10*3/uL (ref 150–400)
RBC: 4.12 MIL/uL (ref 3.87–5.11)
RDW: 14.8 % (ref 11.5–15.5)
WBC: 10 10*3/uL (ref 4.0–10.5)
nRBC: 0 % (ref 0.0–0.2)

## 2018-09-25 MED ORDER — TRAMADOL HCL 50 MG PO TABS: 50.0000 mg | ORAL_TABLET | Freq: Four times a day (QID) | ORAL | 0 refills | Status: AC | PRN

## 2018-09-25 MED ORDER — ASPIRIN 325 MG PO TBEC: 325.0000 mg | DELAYED_RELEASE_TABLET | Freq: Two times a day (BID) | ORAL | 0 refills | Status: AC

## 2018-09-25 MED ORDER — OXYCODONE HCL 5 MG PO TABS: 5.0000 mg | ORAL_TABLET | Freq: Four times a day (QID) | ORAL | 0 refills | Status: AC | PRN

## 2018-09-25 MED ORDER — GABAPENTIN 300 MG PO CAPS: 300.0000 mg | ORAL_CAPSULE | Freq: Three times a day (TID) | ORAL | 0 refills | Status: AC

## 2018-09-25 MED ORDER — METHOCARBAMOL 500 MG PO TABS: 500.0000 mg | ORAL_TABLET | Freq: Four times a day (QID) | ORAL | 0 refills | Status: AC | PRN

## 2018-09-25 NOTE — Progress Notes (Signed)
Physical Therapy Treatment Patient Details Name: Cynthia Dickson MRN: [id number] DOB: Jun 07, 1961 Today's Date: 09/25/2018    History of Present Illness 57 yo female s/p R TKR on 09/24/18. PMH includes OSA, HTN, GERD, L TKR.     PT Comments    Pt performed LE exercises in bed and then ambulated short distance in hallway.  Pt tolerated ambulation better this afternoon.  Pt assisted back to bed for CPM placement by ortho tech.    Follow Up Recommendations  Follow surgeon's recommendation for DC plan and follow-up therapies;Supervision for mobility/OOB     Equipment Recommendations  None recommended by PT    Recommendations for Other Services       Precautions / Restrictions Precautions Precautions: Fall Required Braces or Orthoses: Knee Immobilizer - Right Knee Immobilizer - Right: On when out of bed or walking;Discontinue once straight leg raise with < 10 degree lag Restrictions Other Position/Activity Restrictions: WBAT     Mobility  Bed Mobility Overal bed mobility: Needs Assistance Bed Mobility: Supine to Sit;Sit to Supine     Supine to sit: Min assist;HOB elevated Sit to supine: Min assist   General bed mobility comments: Min assist for RLE management. Increased time and effort to perform.   Transfers Overall transfer level: Needs assistance Equipment used: Rolling walker (2 wheeled) Transfers: Sit to/from Stand Sit to Stand: Min assist         General transfer comment: verbal cues for UE and LE positioning, assist to rise and steady  Ambulation/Gait Ambulation/Gait assistance: Min assist;Min guard Gait Distance (Feet): 50 Feet Assistive device: Rolling walker (2 wheeled) Gait Pattern/deviations: Step-to pattern;Decreased stance time - right;Antalgic;Trunk flexed     General Gait Details: verbal cues for sequence, RW positioning, step length, posture, no dizziness this afternoon, recliner following however not needed   Stairs              Wheelchair Mobility    Modified Rankin (Stroke Patients Only)       Balance                                            Cognition Arousal/Alertness: Awake/alert Behavior During Therapy: WFL for tasks assessed/performed Overall Cognitive Status: Within Functional Limits for tasks assessed                                        Exercises Total Joint Exercises Ankle Circles/Pumps: AROM;10 reps;Both Quad Sets: AROM;10 reps;Both Short Arc Quad: AAROM;10 reps;Right Heel Slides: AAROM;10 reps;Right Hip ABduction/ADduction: AAROM;10 reps;Right Straight Leg Raises: AAROM;10 reps;Right Goniometric ROM: approx 30* AAROM R knee flexion in supine    General Comments        Pertinent Vitals/Pain Pain Assessment: 0-10 Pain Score: 6  Pain Location: R knee  Pain Descriptors / Indicators: Aching;Sore Pain Intervention(s): Limited activity within patient's tolerance;Repositioned;Monitored during session;Ice applied;Premedicated before session    Home Living                      Prior Function            PT Goals (current goals can now be found in the care plan section) Progress towards PT goals: Progressing toward goals    Frequency    7X/week      PT  Plan Current plan remains appropriate    Co-evaluation              AM-PAC PT "6 Clicks" Mobility   Outcome Measure  Help needed turning from your back to your side while in a flat bed without using bedrails?: A Little Help needed moving from lying on your back to sitting on the side of a flat bed without using bedrails?: A Little Help needed moving to and from a bed to a chair (including a wheelchair)?: A Little Help needed standing up from a chair using your arms (e.g., wheelchair or bedside chair)?: A Little Help needed to walk in hospital room?: A Little Help needed climbing 3-5 steps with a railing? : A Lot 6 Click Score: 17    End of Session Equipment  Utilized During Treatment: Gait belt;Right knee immobilizer Activity Tolerance: Patient tolerated treatment well Patient left: in bed;with call bell/phone within reach;with family/visitor present   PT Visit Diagnosis: Other abnormalities of gait and mobility (R26.89);Difficulty in walking, not elsewhere classified (R26.2)     Time: 3818-2993 PT Time Calculation (min) (ACUTE ONLY): 25 min  Charges:  $Gait Training: 8-22 mins $Therapeutic Exercise: 8-22 mins                     Zenovia Jarred, PT, DPT Acute Rehabilitation Services Office: [phone] Pager: [phone]  Cynthia Dickson 09/25/2018, 3:27 PM

## 2018-09-25 NOTE — Progress Notes (Signed)
Physical Therapy Treatment Patient Details Name: Cynthia Dickson MRN: [id number] DOB: 13-Feb-1961 Today's Date: 09/25/2018    History of Present Illness 57 yo female s/p R TKR on 09/24/18. PMH includes OSA, HTN, GERD, L TKR.     PT Comments    Pt premedicated and agreeable to ambulate however had drop in BP limiting ambulation this morning (RN notified).  Follow Up Recommendations  Follow surgeon's recommendation for DC plan and follow-up therapies;Supervision for mobility/OOB     Equipment Recommendations  None recommended by PT    Recommendations for Other Services       Precautions / Restrictions Precautions Precautions: Fall Required Braces or Orthoses: Knee Immobilizer - Right Knee Immobilizer - Right: On when out of bed or walking;Discontinue once straight leg raise with < 10 degree lag Restrictions Weight Bearing Restrictions: No Other Position/Activity Restrictions: WBAT     Mobility  Bed Mobility Overal bed mobility: Needs Assistance Bed Mobility: Supine to Sit     Supine to sit: Min assist;HOB elevated     General bed mobility comments: Min assist for RLE management. Increased time and effort to perform.   Transfers Overall transfer level: Needs assistance Equipment used: Rolling walker (2 wheeled) Transfers: Sit to/from Stand Sit to Stand: Min assist;From elevated surface         General transfer comment: verbal cues for UE and LE positioning, assist to rise, steady and control descent  Ambulation/Gait Ambulation/Gait assistance: Min assist Gait Distance (Feet): 15 Feet Assistive device: Rolling walker (2 wheeled) Gait Pattern/deviations: Step-to pattern;Decreased weight shift to right;Decreased stance time - right;Antalgic;Trunk flexed     General Gait Details: verbal cues for sequence, RW positioning, step length, posture, pt with difficulty following commands and became quiet so had family member pull up recliner behind pt; BP 87/43 mmHg and  HR 50 bpm (NT and RN aware)   Stairs             Wheelchair Mobility    Modified Rankin (Stroke Patients Only)       Balance                                            Cognition Arousal/Alertness: Awake/alert Behavior During Therapy: WFL for tasks assessed/performed Overall Cognitive Status: Within Functional Limits for tasks assessed                                        Exercises      General Comments        Pertinent Vitals/Pain Pain Assessment: 0-10 Pain Score: 7  Pain Location: R knee  Pain Descriptors / Indicators: Aching;Sore Pain Intervention(s): Premedicated before session;Monitored during session;Repositioned;Limited activity within patient's tolerance    Home Living                      Prior Function            PT Goals (current goals can now be found in the care plan section) Progress towards PT goals: Progressing toward goals    Frequency    7X/week      PT Plan Current plan remains appropriate    Co-evaluation              AM-PAC PT "6 Clicks" Mobility   Outcome  Measure  Help needed turning from your back to your side while in a flat bed without using bedrails?: A Little Help needed moving from lying on your back to sitting on the side of a flat bed without using bedrails?: A Lot Help needed moving to and from a bed to a chair (including a wheelchair)?: A Little Help needed standing up from a chair using your arms (e.g., wheelchair or bedside chair)?: A Little Help needed to walk in hospital room?: A Lot Help needed climbing 3-5 steps with a railing? : A Lot 6 Click Score: 15    End of Session Equipment Utilized During Treatment: Gait belt;Right knee immobilizer Activity Tolerance: Treatment limited secondary to medical complications (Comment) Patient left: in chair;with chair alarm set;with call bell/phone within reach;with family/visitor present   PT Visit Diagnosis:  Other abnormalities of gait and mobility (R26.89);Difficulty in walking, not elsewhere classified (R26.2)     Time: 5621-3086 PT Time Calculation (min) (ACUTE ONLY): 21 min  Charges:  $Gait Training: 8-22 mins                     Zenovia Jarred, PT, DPT Acute Rehabilitation Services Office: [phone] Pager: [phone]  Sarajane Jews 09/25/2018, 12:30 PM

## 2018-09-25 NOTE — Progress Notes (Addendum)
PA aware of b/p being low and b/p meds not given.

## 2018-09-25 NOTE — Plan of Care (Signed)
Plan of care reviewed and discussed with patient and her family.

## 2018-09-25 NOTE — Care Management Note (Signed)
Case Management Note  Patient Details  Name: Cynthia Dickson MRN: [id number] Date of Birth: 1961/01/27  Subjective/Objective:   Spoke with patient at bedside. Confirmed plan for OP PT, already arranged. Has RW and 3n1. [phone]                 Action/Plan:   Expected Discharge Date:  09/25/18               Expected Discharge Plan:  OP Rehab  In-House Referral:  NA  Discharge planning Services  CM Consult  Post Acute Care Choice:  NA Choice offered to:  Patient  DME Arranged:  N/A DME Agency:  NA  HH Arranged:  NA HH Agency:  NA  Status of Service:  Completed, signed off  If discussed at Long Length of Stay Meetings, dates discussed:    Additional Comments:  Alexis Goodell, RN 09/25/2018, 11:54 AM

## 2018-09-25 NOTE — Plan of Care (Signed)
  Problem: Health Behavior/Discharge Planning: Goal: Ability to manage health-related needs will improve Outcome: Progressing   Problem: Clinical Measurements: Goal: Ability to maintain clinical measurements within normal limits will improve Outcome: Progressing   Problem: Clinical Measurements: Goal: Will remain free from infection Outcome: Progressing   Problem: Clinical Measurements: Goal: Diagnostic test results will improve Outcome: Progressing   Problem: Activity: Goal: Risk for activity intolerance will decrease Outcome: Progressing   Problem: Elimination: Goal: Will not experience complications related to bowel motility Outcome: Progressing   Problem: Pain Managment: Goal: General experience of comfort will improve Outcome: Progressing

## 2018-09-25 NOTE — Progress Notes (Signed)
   Subjective: 1 Day Post-Op Procedure(s) (LRB): RIGHT TOTAL KNEE ARTHROPLASTY (Right) Patient reports pain as moderate.   Patient seen in rounds by Dr. Lequita Halt. Patient is well, and has had no acute complaints or problems other than pain in the right knee. Had issues with elevated pain levels overnight, IV morphine ordered by provider on call. Pain is better this AM on rounds. Denies chest pain, SOB, or calf pain. Foley catheter removed this AM. We will continue therapy today.   Objective: Vital signs in last 24 hours: Temp:  [96.8 F (36 C)-98.7 F (37.1 C)] 97.5 F (36.4 C) (11/26 0609) Pulse Rate:  [58-75] 61 (11/26 0609) Resp:  [9-18] 16 (11/26 0609) BP: (106-157)/(61-94) 113/63 (11/26 0609) SpO2:  [97 %-100 %] 97 % (11/26 0609) Weight:  [111.8 kg] 111.8 kg (11/25 0841)  Intake/Output from previous day:  Intake/Output Summary (Last 24 hours) at 09/25/2018 0742 Last data filed at 09/25/2018 0600 Gross per 24 hour  Intake 3069.62 ml  Output 1985 ml  Net 1084.62 ml    Labs: Recent Labs    09/25/18 0425  HGB 11.7*   Recent Labs    09/25/18 0425  WBC 10.0  RBC 4.12  HCT 37.8  PLT 226   Recent Labs    09/25/18 0425  NA 139  K 4.2  CL 107  CO2 24  BUN 18  CREATININE 1.10*  GLUCOSE 183*  CALCIUM 8.6*   Exam: General - Patient is Alert and Oriented Extremity - Neurologically intact Neurovascular intact Sensation intact distally Dorsiflexion/Plantar flexion intact Dressing - dressing C/D/I Motor Function - intact, moving foot and toes well on exam.   Past Medical History:  Diagnosis Date  . GERD (gastroesophageal reflux disease)   . Hypertension   . Mild asthma    no inhaler  . Numbness and tingling of both upper extremities    09-19-2018 per pt when wakes up in morning since had bilateral rotator cuff repairs,  then goes away after working it out  . OSA (obstructive sleep apnea)     Assessment/Plan: 1 Day Post-Op Procedure(s) (LRB): RIGHT  TOTAL KNEE ARTHROPLASTY (Right) Active Problems:   OA (osteoarthritis) of knee  Estimated body mass index is 37.46 kg/m as calculated from the following:   Height as of this encounter: 5\' 8"  (1.727 m).   Weight as of this encounter: 111.8 kg. Advance diet Up with therapy  Anticipated LOS equal to or greater than 2 midnights due to - Age 50 and older with one or more of the following:  - Obesity  - Expected need for hospital services (PT, OT, Nursing) required for safe  discharge  - Anticipated need for postoperative skilled nursing care or inpatient rehab  - Active co-morbidities: Chronic pain requiring opiods OR   - Unanticipated findings during/Post Surgery: None  - Patient is a high risk of re-admission due to: None    DVT Prophylaxis - Aspirin Weight bearing as tolerated. D/C O2 and pulse ox and try on room air. Hemovac pulled without difficulty, will continue therapy today.  Plan is to go Home after hospital stay. Possible discharge tomorrow pending progress with therapy.  76, PA-C Orthopedic Surgery 09/25/2018, 7:42 AM

## 2018-09-26 ENCOUNTER — Encounter (HOSPITAL_COMMUNITY): Payer: Self-pay | Admitting: Orthopedic Surgery

## 2018-09-26 LAB — CBC
HCT: 36.5 % (ref 36.0–46.0)
HEMOGLOBIN: 11.4 g/dL — AB (ref 12.0–15.0)
MCH: 28.4 pg (ref 26.0–34.0)
MCHC: 31.2 g/dL (ref 30.0–36.0)
MCV: 90.8 fL (ref 80.0–100.0)
Platelets: 194 10*3/uL (ref 150–400)
RBC: 4.02 MIL/uL (ref 3.87–5.11)
RDW: 14.9 % (ref 11.5–15.5)
WBC: 11.3 10*3/uL — ABNORMAL HIGH (ref 4.0–10.5)
nRBC: 0 % (ref 0.0–0.2)

## 2018-09-26 LAB — BASIC METABOLIC PANEL
Anion gap: 6 (ref 5–15)
BUN: 14 mg/dL (ref 6–20)
CHLORIDE: 106 mmol/L (ref 98–111)
CO2: 26 mmol/L (ref 22–32)
CREATININE: 0.82 mg/dL (ref 0.44–1.00)
Calcium: 8.5 mg/dL — ABNORMAL LOW (ref 8.9–10.3)
GFR calc Af Amer: >60 mL/min (ref >60)
GFR calc non Af Amer: >60 mL/min (ref >60)
Glucose, Bld: 139 mg/dL — ABNORMAL HIGH (ref 70–99)
POTASSIUM: 4.1 mmol/L (ref 3.5–5.1)
Sodium: 138 mmol/L (ref 135–145)

## 2018-09-26 MED ORDER — TRAMADOL HCL 50 MG PO TABS
50.0000 mg | ORAL_TABLET | Freq: Four times a day (QID) | ORAL | Status: DC | PRN
  Administered 2018-09-26: 100 mg via ORAL
  Filled 2018-09-26: qty 2

## 2018-09-26 NOTE — Plan of Care (Signed)
Pt is stable. Plan of care reviewed . Pain management in progress, effective. 

## 2018-09-26 NOTE — Progress Notes (Signed)
Physical Therapy Treatment Patient Details Name: Cynthia Dickson MRN: [id number] DOB: [birth year] Today's Date: 09/26/2018    History of Present Illness 57 yo female s/p R TKR on 09/24/18. PMH includes OSA, HTN, GERD, L TKR.     PT Comments    Pt ambulated in hallway and performed LE exercises.  Pt provided with HEP.  Pt reports being ready for d/c home today.  Pt had no further questions.   Follow Up Recommendations  Follow surgeon's recommendation for DC plan and follow-up therapies;Supervision for mobility/OOB     Equipment Recommendations  None recommended by PT    Recommendations for Other Services       Precautions / Restrictions Precautions Precautions: Fall;Knee Required Braces or Orthoses: Knee Immobilizer - Right Knee Immobilizer - Right: On when out of bed or walking;Discontinue once straight leg raise with < 10 degree lag Restrictions Other Position/Activity Restrictions: WBAT     Mobility  Bed Mobility Overal bed mobility: Needs Assistance Bed Mobility: Supine to Sit     Supine to sit: Min guard;HOB elevated     General bed mobility comments: verbal cues for self assist  Transfers Overall transfer level: Needs assistance Equipment used: Rolling walker (2 wheeled) Transfers: Sit to/from Stand Sit to Stand: Min guard         General transfer comment: verbal cues for UE and LE positioning  Ambulation/Gait Ambulation/Gait assistance: Min guard Gait Distance (Feet): 60 Feet Assistive device: Rolling walker (2 wheeled) Gait Pattern/deviations: Step-to pattern;Decreased stance time - right;Antalgic;Trunk flexed Gait velocity: decr    General Gait Details: verbal cues for sequence, RW positioning, step length, posture   Stairs             Wheelchair Mobility    Modified Rankin (Stroke Patients Only)       Balance                                            Cognition Arousal/Alertness: Awake/alert Behavior  During Therapy: WFL for tasks assessed/performed Overall Cognitive Status: Within Functional Limits for tasks assessed                                        Exercises Total Joint Exercises Ankle Circles/Pumps: AROM;10 reps;Both Quad Sets: AROM;10 reps;Both Short Arc Quad: AAROM;10 reps;Right Heel Slides: AAROM;10 reps;Right Hip ABduction/ADduction: AAROM;10 reps;Right Straight Leg Raises: AAROM;10 reps;Right    General Comments        Pertinent Vitals/Pain Pain Assessment: 0-10 Pain Score: 5  Pain Location: R knee  Pain Descriptors / Indicators: Aching;Sore Pain Intervention(s): Limited activity within patient's tolerance;Repositioned;Monitored during session;Ice applied    Home Living                      Prior Function            PT Goals (current goals can now be found in the care plan section) Progress towards PT goals: Progressing toward goals    Frequency    7X/week      PT Plan Current plan remains appropriate    Co-evaluation              AM-PAC PT "6 Clicks" Mobility   Outcome Measure  Help needed turning from your back to your side while  in a flat bed without using bedrails?: A Little Help needed moving from lying on your back to sitting on the side of a flat bed without using bedrails?: A Little Help needed moving to and from a bed to a chair (including a wheelchair)?: A Little Help needed standing up from a chair using your arms (e.g., wheelchair or bedside chair)?: A Little Help needed to walk in hospital room?: A Little Help needed climbing 3-5 steps with a railing? : A Little 6 Click Score: 18    End of Session Equipment Utilized During Treatment: Gait belt;Right knee immobilizer Activity Tolerance: Patient tolerated treatment well Patient left: with call bell/phone within reach;with family/visitor present;in chair Nurse Communication: Mobility status PT Visit Diagnosis: Other abnormalities of gait and  mobility (R26.89);Difficulty in walking, not elsewhere classified (R26.2)     Time: 3976-7341 PT Time Calculation (min) (ACUTE ONLY): 28 min  Charges:  $Gait Training: 8-22 mins $Therapeutic Exercise: 8-22 mins                     Zenovia Jarred, PT, DPT Acute Rehabilitation Services Office: [phone] Pager: [phone]  Cynthia Dickson 09/26/2018, 3:05 PM

## 2018-09-26 NOTE — Progress Notes (Signed)
   Subjective: 2 Days Post-Op Procedure(s) (LRB): RIGHT TOTAL KNEE ARTHROPLASTY (Right) Patient reports pain as mild.   Patient seen in rounds for Dr. Lequita Halt. Patient is well, and has had no acute complaints or problems. States she is ready to go home. Denies chest pain, SOB, or calf pain. Voiding without difficulty and positive flatus.  Plan is to go Home after hospital stay.  Objective: Vital signs in last 24 hours: Temp:  [97.6 F (36.4 C)-98.3 F (36.8 C)] 97.7 F (36.5 C) (11/27 0446) Pulse Rate:  [51-75] 66 (11/27 0446) Resp:  [18] 18 (11/27 0446) BP: (100-137)/(57-86) 137/69 (11/27 0446) SpO2:  [98 %-100 %] 98 % (11/27 0446)  Intake/Output from previous day:  Intake/Output Summary (Last 24 hours) at 09/26/2018 0727 Last data filed at 09/26/2018 0345 Gross per 24 hour  Intake 1120.76 ml  Output 1375 ml  Net -254.24 ml    Intake/Output this shift: No intake/output data recorded.  Labs: Recent Labs    09/25/18 0425 09/26/18 0528  HGB 11.7* 11.4*   Recent Labs    09/25/18 0425 09/26/18 0528  WBC 10.0 11.3*  RBC 4.12 4.02  HCT 37.8 36.5  PLT 226 194   Recent Labs    09/25/18 0425 09/26/18 0528  NA 139 138  K 4.2 4.1  CL 107 106  CO2 24 26  BUN 18 14  CREATININE 1.10* 0.82  GLUCOSE 183* 139*  CALCIUM 8.6* 8.5*   No results for input(s): LABPT, INR in the last 72 hours.  Exam: General - Patient is Alert and Oriented Extremity - Neurologically intact Neurovascular intact Sensation intact distally Dorsiflexion/Plantar flexion intact Dressing/Incision - clean, dry, no drainage Motor Function - intact, moving foot and toes well on exam.   Past Medical History:  Diagnosis Date  . GERD (gastroesophageal reflux disease)   . Hypertension   . Mild asthma    no inhaler  . Numbness and tingling of both upper extremities    09-19-2018 per pt when wakes up in morning since had bilateral rotator cuff repairs,  then goes away after working it out  .  OSA (obstructive sleep apnea)     Assessment/Plan: 2 Days Post-Op Procedure(s) (LRB): RIGHT TOTAL KNEE ARTHROPLASTY (Right) Active Problems:   OA (osteoarthritis) of knee  Estimated body mass index is 37.46 kg/m as calculated from the following:   Height as of this encounter: 5\' 8"  (1.727 m).   Weight as of this encounter: 111.8 kg. Up with therapy D/C IV fluids  DVT Prophylaxis - Aspirin Weight-bearing as tolerated  Plan for discharge to home today with outpatient PT at [facility] in [place]. Follow-up in the office in 2 weeks with Dr. Grove.  Lequita Halt, PA-C Orthopedic Surgery 09/26/2018, 7:27 AM

## 2018-10-01 NOTE — Discharge Summary (Signed)
Physician Discharge Summary   Patient ID: Cynthia Dickson MRN: [id number] DOB/AGE: 03-14-1961 57 y.o.  Admit date: 09/24/2018 Discharge date: 09/26/2018  Primary Diagnosis: Osteoarthritis, right knee   Admission Diagnoses:  Past Medical History:  Diagnosis Date  . GERD (gastroesophageal reflux disease)   . Hypertension   . Mild asthma    no inhaler  . Numbness and tingling of both upper extremities    09-19-2018 per pt when wakes up in morning since had bilateral rotator cuff repairs,  then goes away after working it out  . OSA (obstructive sleep apnea)    Discharge Diagnoses:   Active Problems:   OA (osteoarthritis) of knee  Estimated body mass index is 37.46 kg/m as calculated from the following:   Height as of this encounter: _0  (1.727 m).   Weight as of this encounter: 111.8 kg.  Procedure:  Procedure(s) (LRB): RIGHT TOTAL KNEE ARTHROPLASTY (Right)   Consults: None  HPI: Cynthia Dickson is a 57 y.o. year old female with end stage OA of her right knee with progressively worsening pain and dysfunction. She has constant pain, with activity and at rest and significant functional deficits with difficulties even with ADLs. She has had extensive non-op management including analgesics, injections of cortisone and viscosupplements, and home exercise program, but remains in significant pain with significant dysfunction.Radiographs show bone on bone arthritis medial and patellofemoral. She presents now for right Total Knee Arthroplasty.    Laboratory Data: Admission on 09/24/2018, Discharged on 09/26/2018  Component Date Value Ref Range Status  . WBC 09/25/2018 10.0  4.0 - 10.5 K/uL Final  . RBC 09/25/2018 4.12  3.87 - 5.11 MIL/uL Final  . Hemoglobin 09/25/2018 11.7* 12.0 - 15.0 g/dL Final  . HCT 09/25/2018 37.8  36.0 - 46.0 % Final  . MCV 09/25/2018 91.7  80.0 - 100.0 fL Final  . MCH 09/25/2018 28.4  26.0 - 34.0 pg Final  . MCHC 09/25/2018 31.0  30.0 - 36.0 g/dL Final    . RDW 09/25/2018 14.8  11.5 - 15.5 % Final  . Platelets 09/25/2018 226  150 - 400 K/uL Final  . nRBC 09/25/2018 0.0  0.0 - 0.2 % Final   Performed at [facility], [place] [address], [place], [place] [address]  . Sodium 09/25/2018 139  135 - 145 mmol/L Final  . Potassium 09/25/2018 4.2  3.5 - 5.1 mmol/L Final  . Chloride 09/25/2018 107  98 - 111 mmol/L Final  . CO2 09/25/2018 24  22 - 32 mmol/L Final  . Glucose, Bld 09/25/2018 183* 70 - 99 mg/dL Final  . BUN 09/25/2018 18  6 - 20 mg/dL Final  . Creatinine, Ser 09/25/2018 1.10* 0.44 - 1.00 mg/dL Final  . Calcium 09/25/2018 8.6* 8.9 - 10.3 mg/dL Final  . GFR calc non Af Amer 09/25/2018 54* >60 mL/min Final  . GFR calc Af Amer 09/25/2018 >60  >60 mL/min Final   Comment: (NOTE) The eGFR has been calculated using the CKD EPI equation. This calculation has not been validated in all clinical situations. eGFR's persistently <60 mL/min signify possible Chronic Kidney Disease.   Georgiann Hahn gap 09/25/2018 8  5 - 15 Final   Performed at [facility], [place] [address], [place], [place] [address]  . WBC 09/26/2018 11.3* 4.0 - 10.5 K/uL Final  . RBC 09/26/2018 4.02  3.87 - 5.11 MIL/uL Final  . Hemoglobin 09/26/2018 11.4* 12.0 - 15.0 g/dL Final  . HCT 09/26/2018 36.5  36.0 -  46.0 % Final  . MCV 09/26/2018 90.8  80.0 - 100.0 fL Final  . MCH 09/26/2018 28.4  26.0 - 34.0 pg Final  . MCHC 09/26/2018 31.2  30.0 - 36.0 g/dL Final  . RDW 09/26/2018 14.9  11.5 - 15.5 % Final  . Platelets 09/26/2018 194  150 - 400 K/uL Final  . nRBC 09/26/2018 0.0  0.0 - 0.2 % Final   Performed at [facility], [place] [address], [place], [place] [address]  . Sodium 09/26/2018 138  135 - 145 mmol/L Final  . Potassium 09/26/2018 4.1  3.5 - 5.1 mmol/L Final  . Chloride 09/26/2018 106  98 - 111 mmol/L Final  . CO2 09/26/2018 26  22 - 32 mmol/L Final  . Glucose, Bld 09/26/2018 139* 70 - 99 mg/dL Final  . BUN 09/26/2018 14  6 -  20 mg/dL Final  . Creatinine, Ser 09/26/2018 0.82  0.44 - 1.00 mg/dL Final  . Calcium 09/26/2018 8.5* 8.9 - 10.3 mg/dL Final  . GFR calc non Af Amer 09/26/2018 >60  >60 mL/min Final  . GFR calc Af Amer 09/26/2018 >60  >60 mL/min Final  . Anion gap 09/26/2018 6  5 - 15 Final   Performed at [facility], [place] [address], [place], [place] [address]  Hospital Outpatient Visit on 09/19/2018  Component Date Value Ref Range Status  . MRSA, PCR 09/19/2018 NEGATIVE  NEGATIVE Final  . Staphylococcus aureus 09/19/2018 NEGATIVE  NEGATIVE Final   Comment: (NOTE) The Xpert SA Assay (FDA approved for NASAL specimens in patients 46 years of age and older), is one component of a comprehensive surveillance program. It is not intended to diagnose infection nor to guide or monitor treatment. Performed at [facility], [place] [address], [place], [place] [address]   . aPTT 09/19/2018 33  24 - 36 seconds Final   Performed at [facility], [place] [address] [place], [place] [address]  . WBC 09/19/2018 4.4  4.0 - 10.5 K/uL Final  . RBC 09/19/2018 4.41  3.87 - 5.11 MIL/uL Final  . Hemoglobin 09/19/2018 12.7  12.0 - 15.0 g/dL Final  . HCT 09/19/2018 40.8  36.0 - 46.0 % Final  . MCV 09/19/2018 92.5  80.0 - 100.0 fL Final  . MCH 09/19/2018 28.8  26.0 - 34.0 pg Final  . MCHC 09/19/2018 31.1  30.0 - 36.0 g/dL Final  . RDW 09/19/2018 15.0  11.5 - 15.5 % Final  . Platelets 09/19/2018 200  150 - 400 K/uL Final  . nRBC 09/19/2018 0.0  0.0 - 0.2 % Final   Performed at [facility], [place] [address], [place], [place] [address]  . Sodium 09/19/2018 141  135 - 145 mmol/L Final  . Potassium 09/19/2018 4.3  3.5 - 5.1 mmol/L Final  . Chloride 09/19/2018 109  98 - 111 mmol/L Final  . CO2 09/19/2018 26  22 - 32 mmol/L Final  . Glucose, Bld 09/19/2018 115* 70 - 99 mg/dL Final  . BUN 09/19/2018 15  6 - 20 mg/dL Final  . Creatinine, Ser 09/19/2018 0.94   0.44 - 1.00 mg/dL Final  . Calcium 09/19/2018 9.0  8.9 - 10.3 mg/dL Final  . Total Protein 09/19/2018 7.5  6.5 - 8.1 g/dL Final  . Albumin 09/19/2018 4.1  3.5 - 5.0 g/dL Final  . AST 09/19/2018 23  15 - 41 U/L Final  . ALT 09/19/2018 22  0 - 44 U/L Final  . Alkaline  Phosphatase 09/19/2018 67  38 - 126 U/L Final  . Total Bilirubin 09/19/2018 0.6  0.3 - 1.2 mg/dL Final  . GFR calc non Af Amer 09/19/2018 >60  >60 mL/min Final  . GFR calc Af Amer 09/19/2018 >60  >60 mL/min Final   Comment: (NOTE) The eGFR has been calculated using the CKD EPI equation. This calculation has not been validated in all clinical situations. eGFR's persistently <60 mL/min signify possible Chronic Kidney Disease.   Georgiann Hahn gap 09/19/2018 6  5 - 15 Final   Performed at [facility], [place] [address] [place], [place] [address]  . Prothrombin Time 09/19/2018 12.7  11.4 - 15.2 seconds Final  . INR 09/19/2018 0.96   Final   Performed at [facility], [place] [address], [place], [place] [address]  . ABO/RH(D) 09/19/2018 O POS   Final  . Antibody Screen 09/19/2018 NEG   Final  . Sample Expiration 09/19/2018 09/27/2018   Final  . Extend sample reason 09/19/2018    Final                   Value:NO TRANSFUSIONS OR PREGNANCY IN THE PAST 3 MONTHS Performed at [place] [address], [place], [place] [address]      X-Rays:No results found.  EKG: Orders placed or performed during the hospital encounter of 09/19/18  . EKG 12-Lead  . EKG 12-Lead     Hospital Course: LINNA THEBEAU is a 57 y.o. who was admitted to [facility]. They were brought to the operating room on 09/24/2018 and underwent Procedure(s): RIGHT TOTAL KNEE ARTHROPLASTY.  Patient tolerated the procedure well and was later transferred to the recovery room and then to the orthopaedic floor for postoperative care. They were given PO and IV analgesics for pain control following their  surgery. They were given 24 hours of postoperative antibiotics of  Anti-infectives (From admission, onward)   Start     Dose/Rate Route Frequency Ordered Stop   09/24/18 1800  ceFAZolin (ANCEF) IVPB 2g/100 mL premix     2 g 200 mL/hr over 30 Minutes Intravenous Every 6 hours 09/24/18 1441 09/24/18 2354   09/24/18 0845  ceFAZolin (ANCEF) IVPB 2g/100 mL premix     2 g 200 mL/hr over 30 Minutes Intravenous On call to O.R. 09/24/18 0844 09/24/18 1200     and started on DVT prophylaxis in the form of Aspirin.   PT and OT were ordered for total joint protocol. Discharge planning consulted to help with postop disposition and equipment needs. Patient had a decent night on the evening of surgery. Had some issues with elevated pain levels, for which IV morphine was ordered. Pain had improved during rounds on day one.They started to get up OOB with therapy on POD #0. Hemovac drain was pulled without difficulty on day one. Continued to work with therapy into POD #2. Pt was seen during rounds on day two and was ready to go home pending progress with therapy. Dressing was changed and the incision was clean, dry, and intact with no drainage. Pt worked with therapy for one additional session and was meeting their goals. She was discharged to home later that day in stable condition.  Diet: Regular diet Activity: WBAT Follow-up: in 2 weeks with Dr. Wynelle Link Disposition: Home with outpatient PT at [facility] in [place] Discharged Condition: stable   Discharge Instructions    Call MD / Call 911   Complete by:  As directed  If you experience chest pain or shortness of breath, CALL 911 and be transported to the hospital emergency room.  If you develope a fever above 101 F, pus (white drainage) or increased drainage or redness at the wound, or calf pain, call your surgeon's office.   Change dressing   Complete by:  As directed    Change the dressing daily with sterile 4 x 4 inch gauze dressing and apply TED hose.    Constipation Prevention   Complete by:  As directed    Drink plenty of fluids.  Prune juice may be helpful.  You may use a stool softener, such as Colace (over the counter) 100 mg twice a day.  Use MiraLax (over the counter) for constipation as needed.   Diet - low sodium heart healthy   Complete by:  As directed    Discharge instructions   Complete by:  As directed    Dr. Gaynelle Arabian Total Joint Specialist [facility] [address] [place], [place] [address] [phone]  TOTAL KNEE REPLACEMENT POSTOPERATIVE DIRECTIONS  Knee Rehabilitation, Guidelines Following Surgery  Results after knee surgery are often greatly improved when you follow the exercise, range of motion and muscle strengthening exercises prescribed by your doctor. Safety measures are also important to protect the knee from further injury. Any time any of these exercises cause you to have increased pain or swelling in your knee joint, decrease the amount until you are comfortable again and slowly increase them. If you have problems or questions, call your caregiver or physical therapist for advice.   HOME CARE INSTRUCTIONS  Remove items at home which could result in a fall. This includes throw rugs or furniture in walking pathways.  ICE to the affected knee every three hours for 30 minutes at a time and then as needed for pain and swelling.  Continue to use ice on the knee for pain and swelling from surgery. You may notice swelling that will progress down to the foot and ankle.  This is normal after surgery.  Elevate the leg when you are not up walking on it.   Continue to use the breathing machine which will help keep your temperature down.  It is common for your temperature to cycle up and down following surgery, especially at night when you are not up moving around and exerting yourself.  The breathing machine keeps your lungs expanded and your temperature down. Do not place pillow under knee, focus on keeping  the knee straight while resting   DIET You may resume your previous home diet once your are discharged from the hospital.  DRESSING / WOUND CARE / SHOWERING You may shower 3 days after surgery, but keep the wounds dry during showering.  You may use an occlusive plastic wrap (Press'n Seal for example), NO SOAKING/SUBMERGING IN THE BATHTUB.  If the bandage gets wet, change with a clean dry gauze.  If the incision gets wet, pat the wound dry with a clean towel. You may start showering once you are discharged home but do not submerge the incision under water. Just pat the incision dry and apply a dry gauze dressing on daily. Change the surgical dressing daily and reapply a dry dressing each time.  ACTIVITY Walk with your walker as instructed. Use walker as long as suggested by your caregivers. Avoid periods of inactivity such as sitting longer than an hour when not asleep. This helps prevent blood clots.  You may resume a sexual relationship in one  month or when given the OK by your doctor.  You may return to work once you are cleared by your doctor.  Do not drive a car for 6 weeks or until released by you surgeon.  Do not drive while taking narcotics.  WEIGHT BEARING Weight bearing as tolerated with assist device (walker, cane, etc) as directed, use it as long as suggested by your surgeon or therapist, typically at least 4-6 weeks.  POSTOPERATIVE CONSTIPATION PROTOCOL Constipation - defined medically as fewer than three stools per week and severe constipation as less than one stool per week.  One of the most common issues patients have following surgery is constipation.  Even if you have a regular bowel pattern at home, your normal regimen is likely to be disrupted due to multiple reasons following surgery.  Combination of anesthesia, postoperative narcotics, change in appetite and fluid intake all can affect your bowels.  In order to avoid complications following surgery, here are some  recommendations in order to help you during your recovery period.  Colace (docusate) - Pick up an over-the-counter form of Colace or another stool softener and take twice a day as long as you are requiring postoperative pain medications.  Take with a full glass of water daily.  If you experience loose stools or diarrhea, hold the colace until you stool forms back up.  If your symptoms do not get better within 1 week or if they get worse, check with your doctor.  Dulcolax (bisacodyl) - Pick up over-the-counter and take as directed by the product packaging as needed to assist with the movement of your bowels.  Take with a full glass of water.  Use this product as needed if not relieved by Colace only.   MiraLax (polyethylene glycol) - Pick up over-the-counter to have on hand.  MiraLax is a solution that will increase the amount of water in your bowels to assist with bowel movements.  Take as directed and can mix with a glass of water, juice, soda, coffee, or tea.  Take if you go more than two days without a movement. Do not use MiraLax more than once per day. Call your doctor if you are still constipated or irregular after using this medication for 7 days in a row.  If you continue to have problems with postoperative constipation, please contact the office for further assistance and recommendations.  If you experience "the worst abdominal pain ever" or develop nausea or vomiting, please contact the office immediatly for further recommendations for treatment.  ITCHING  If you experience itching with your medications, try taking only a single pain pill, or even half a pain pill at a time.  You can also use Benadryl over the counter for itching or also to help with sleep.   TED HOSE STOCKINGS Wear the elastic stockings on both legs for three weeks following surgery during the day but you may remove then at night for sleeping.  MEDICATIONS See your medication summary on the "After Visit Summary" that the  nursing staff will review with you prior to discharge.  You may have some home medications which will be placed on hold until you complete the course of blood thinner medication.  It is important for you to complete the blood thinner medication as prescribed by your surgeon.  Continue your approved medications as instructed at time of discharge.  PRECAUTIONS If you experience chest pain or shortness of breath - call 911 immediately for transfer to the hospital emergency department.  If  you develop a fever greater that 101 F, purulent drainage from wound, increased redness or drainage from wound, foul odor from the wound/dressing, or calf pain - CONTACT YOUR SURGEON.                                                   FOLLOW-UP APPOINTMENTS Make sure you keep all of your appointments after your operation with your surgeon and caregivers. You should call the office at the above phone number and make an appointment for approximately two weeks after the date of your surgery or on the date instructed by your surgeon outlined in the "After Visit Summary".   RANGE OF MOTION AND STRENGTHENING EXERCISES  Rehabilitation of the knee is important following a knee injury or an operation. After just a few days of immobilization, the muscles of the thigh which control the knee become weakened and shrink (atrophy). Knee exercises are designed to build up the tone and strength of the thigh muscles and to improve knee motion. Often times heat used for twenty to thirty minutes before working out will loosen up your tissues and help with improving the range of motion but do not use heat for the first two weeks following surgery. These exercises can be done on a training (exercise) mat, on the floor, on a table or on a bed. Use what ever works the best and is most comfortable for you Knee exercises include:  Leg Lifts - While your knee is still immobilized in a splint or cast, you can do straight leg raises. Lift the leg to 60  degrees, hold for 3 sec, and slowly lower the leg. Repeat 10-20 times 2-3 times daily. Perform this exercise against resistance later as your knee gets better.  Quad and Hamstring Sets - Tighten up the muscle on the front of the thigh (Quad) and hold for 5-10 sec. Repeat this 10-20 times hourly. Hamstring sets are done by pushing the foot backward against an object and holding for 5-10 sec. Repeat as with quad sets.  Leg Slides: Lying on your back, slowly slide your foot toward your buttocks, bending your knee up off the floor (only go as far as is comfortable). Then slowly slide your foot back down until your leg is flat on the floor again. Angel Wings: Lying on your back spread your legs to the side as far apart as you can without causing discomfort.  A rehabilitation program following serious knee injuries can speed recovery and prevent re-injury in the future due to weakened muscles. Contact your doctor or a physical therapist for more information on knee rehabilitation.   IF YOU ARE TRANSFERRED TO A SKILLED REHAB FACILITY If the patient is transferred to a skilled rehab facility following release from the hospital, a list of the current medications will be sent to the facility for the patient to continue.  When discharged from the skilled rehab facility, please have the facility set up the patient's [place] prior to being released. Also, the skilled facility will be responsible for providing the patient with their medications at time of release from the facility to include their pain medication, the muscle relaxants, and their blood thinner medication. If the patient is still at the rehab facility at time of the two week follow up appointment, the skilled rehab facility will also need to  assist the patient in arranging follow up appointment in our office and any transportation needs.  MAKE SURE YOU:  Understand these instructions.  Get help right away if you are not doing well or  get worse.    Pick up stool softner and laxative for home use following surgery while on pain medications. Do not submerge incision under water. Please use good hand washing techniques while changing dressing each day. May shower starting three days after surgery. Please use a clean towel to pat the incision dry following showers. Continue to use ice for pain and swelling after surgery. Do not use any lotions or creams on the incision until instructed by your surgeon.   Do not put a pillow under the knee. Place it under the heel.   Complete by:  As directed    Driving restrictions   Complete by:  As directed    No driving for two weeks   TED hose   Complete by:  As directed    Use stockings (TED hose) for three weeks on both leg(s).  You may remove them at night for sleeping.   Weight bearing as tolerated   Complete by:  As directed      Allergies as of 09/26/2018      Reactions   Codeine Shortness Of Breath, Itching   Latex Itching, Swelling   Latex balloons cause lips to swell       Medication List    STOP taking these medications   HYDROcodone-acetaminophen 7.5-325 MG tablet Commonly known as:  NORCO     TAKE these medications   aspirin 325 MG EC tablet Take 1 tablet (325 mg total) by mouth 2 (two) times daily for 19 days. Take one tablet (325 mg) Aspirin two times a day for three weeks following surgery. Then take one baby Aspirin (81 mg) once a day for three weeks. Then discontinue aspirin.   furosemide 20 MG tablet Commonly known as:  LASIX Take 20 mg by mouth every other day.   gabapentin 300 MG capsule Commonly known as:  NEURONTIN Take 1 capsule (300 mg total) by mouth 3 (three) times daily. Gabapentin 300 mg Protocol Take a 300 mg capsule three times a day for two weeks following surgery. Then take a 300 mg capsule two times a day for two weeks.  Then take a 300 mg capsule once a day for two weeks.  Then resume 100 mg gabapentin two times a day as  needed. What changed:    medication strength  how much to take  when to take this  reasons to take this  additional instructions   methocarbamol 500 MG tablet Commonly known as:  ROBAXIN Take 1 tablet (500 mg total) by mouth every 6 (six) hours as needed for muscle spasms.   olmesartan 20 MG tablet Commonly known as:  BENICAR Take 20 mg by mouth daily.   oxyCODONE 5 MG immediate release tablet Commonly known as:  Oxy IR/ROXICODONE Take 1 tablet (5 mg total) by mouth every 6 (six) hours as needed for moderate pain.   ranitidine 300 MG tablet Commonly known as:  ZANTAC Take 300 mg by mouth at bedtime.   traMADol 50 MG tablet Commonly known as:  ULTRAM Take 1-2 tablets (50-100 mg total) by mouth every 6 (six) hours as needed for moderate pain.            Discharge Care Instructions  (From admission, onward)         Start  Ordered   09/25/18 0000  Weight bearing as tolerated     09/25/18 0747   09/25/18 0000  Change dressing    Comments:  Change the dressing daily with sterile 4 x 4 inch gauze dressing and apply TED hose.   09/25/18 0747         Follow-up Information    Gaynelle Arabian, MD. Schedule an appointment as soon as possible for a visit on 10/09/2018.   Specialty:  Orthopedic Surgery Contact information: [address] [place] [address] [phone]           Signed: Theresa Duty, PA-C Orthopedic Surgery 10/01/2018, 10:14 AM

## 2018-10-09 DIAGNOSIS — Z96651 Presence of right artificial knee joint: Secondary | ICD-10-CM | POA: Insufficient documentation

## 2019-12-20 ENCOUNTER — Other Ambulatory Visit: Payer: Self-pay | Admitting: Orthopedic Surgery

## 2019-12-20 ENCOUNTER — Other Ambulatory Visit (HOSPITAL_COMMUNITY): Payer: Self-pay | Admitting: Orthopedic Surgery

## 2019-12-20 DIAGNOSIS — Z96652 Presence of left artificial knee joint: Secondary | ICD-10-CM

## 2019-12-31 ENCOUNTER — Other Ambulatory Visit: Payer: Self-pay

## 2019-12-31 ENCOUNTER — Encounter (HOSPITAL_COMMUNITY)
Admission: RE | Admit: 2019-12-31 | Discharge: 2019-12-31 | Disposition: A | Discharge status: Home / Self Care | Payer: PRIVATE HEALTH INSURANCE | Source: Ambulatory Visit | Attending: Orthopedic Surgery | Admitting: Orthopedic Surgery

## 2019-12-31 ENCOUNTER — Other Ambulatory Visit (HOSPITAL_COMMUNITY): Payer: Self-pay

## 2019-12-31 DIAGNOSIS — Z96652 Presence of left artificial knee joint: Secondary | ICD-10-CM | POA: Diagnosis present

## 2019-12-31 IMAGING — NM NM BONE 3 PHASE
2 series · 12 of 12 positions shown · non-contrast
Comparison: None

Radiographic correlation: None available

CLINICAL DATA: BILATERAL knee pain since surgery, prior BILATERAL
knee replacements on LEFT in [ZH] and on RIGHT in [ZH], patient
denies history of trauma

EXAM:
NUCLEAR MEDICINE 3-PHASE BONE SCAN
TECHNIQUE: Radionuclide angiographic images, immediate static blood pool
images, and 3-hour delayed static images were obtained of the knees
after intravenous injection of radiopharmaceutical.
RADIOPHARMACEUTICALS:  19.9 mCi [ZH] MDP IV

[Series 1: flow · 4.14mm/px · 6 of 40 frames shown (1 of 2)]
[frame 4/40]
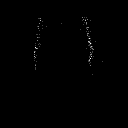
[frame 10/40  full-range]
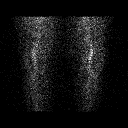
[frame 17/40  full-range]
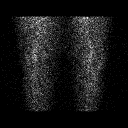
[frame 24/40  full-range]
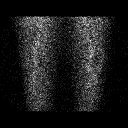
[frame 30/40  full-range]
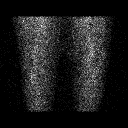
[frame 37/40  full-range]
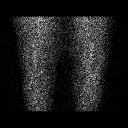

[Series 1: flow · 4.14mm/px · 6 of 40 frames shown (2 of 2)]
[frame 4/40]
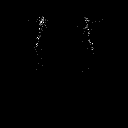
[frame 10/40  full-range]
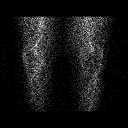
[frame 17/40  full-range]
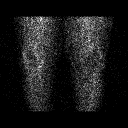
[frame 24/40  full-range]
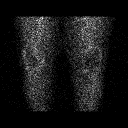
[frame 30/40  full-range]
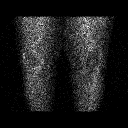
[frame 37/40  full-range]
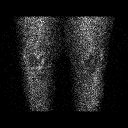

[12 of 12 positions shown; findings below may reference images not displayed]

FINDINGS: Vascular phase: Mildly increased blood flow to the periarticular
regions of the knees bilaterally greater on RIGHT

Blood pool phase: Increased blood pool surrounding the knee joints
bilaterally, slightly greater on RIGHT

Delayed phase: Photopenic defects at both knees from prosthetic
hardware. No abnormal tracer uptake identified in LEFT knee adjacent
to the prosthesis. On the RIGHT, abnormal focal increased tracer
uptake is identified at the medial tibial plateau adjacent to
hardware suspicious for aseptic loosening, less likely infection. No
abnormal uptake adjacent to the femoral component of the RIGHT knee
prosthesis.
IMPRESSION: Increased blood flow and blood pool to the periarticular regions of
both knees consistent with hyperemia and question synovitis.

Abnormal focal increased delayed uptake of tracer at the RIGHT
medial tibial plateau adjacent to the prosthesis concerning for
aseptic loosening of the RIGHT knee prosthesis.

No scintigraphic evidence of aseptic loosening of the LEFT knee
prosthesis.

## 2019-12-31 MED ORDER — TECHNETIUM TC 99M MEDRONATE IV KIT
20.0000 | PACK | Freq: Once | INTRAVENOUS | Status: AC | PRN
  Administered 2019-12-31: 19.9 via INTRAVENOUS

## 2021-06-25 DIAGNOSIS — M7051 Other bursitis of knee, right knee: Secondary | ICD-10-CM | POA: Insufficient documentation

## 2021-11-22 DIAGNOSIS — Z6836 Body mass index (BMI) 36.0-36.9, adult: Secondary | ICD-10-CM | POA: Diagnosis not present

## 2021-11-22 DIAGNOSIS — S43492A Other sprain of left shoulder joint, initial encounter: Secondary | ICD-10-CM | POA: Diagnosis not present

## 2021-11-30 DIAGNOSIS — M2550 Pain in unspecified joint: Secondary | ICD-10-CM | POA: Diagnosis not present

## 2021-11-30 DIAGNOSIS — G8929 Other chronic pain: Secondary | ICD-10-CM | POA: Diagnosis not present

## 2021-11-30 DIAGNOSIS — Z13820 Encounter for screening for osteoporosis: Secondary | ICD-10-CM | POA: Diagnosis not present

## 2021-11-30 DIAGNOSIS — M79643 Pain in unspecified hand: Secondary | ICD-10-CM | POA: Diagnosis not present

## 2021-11-30 DIAGNOSIS — Z7952 Long term (current) use of systemic steroids: Secondary | ICD-10-CM | POA: Diagnosis not present

## 2021-11-30 DIAGNOSIS — M7989 Other specified soft tissue disorders: Secondary | ICD-10-CM | POA: Diagnosis not present

## 2021-11-30 DIAGNOSIS — Z79899 Other long term (current) drug therapy: Secondary | ICD-10-CM | POA: Diagnosis not present

## 2021-11-30 DIAGNOSIS — M1990 Unspecified osteoarthritis, unspecified site: Secondary | ICD-10-CM | POA: Diagnosis not present

## 2021-11-30 DIAGNOSIS — M0579 Rheumatoid arthritis with rheumatoid factor of multiple sites without organ or systems involvement: Secondary | ICD-10-CM | POA: Diagnosis not present

## 2021-11-30 DIAGNOSIS — M25569 Pain in unspecified knee: Secondary | ICD-10-CM | POA: Diagnosis not present

## 2021-12-09 DIAGNOSIS — E7849 Other hyperlipidemia: Secondary | ICD-10-CM | POA: Diagnosis not present

## 2021-12-09 DIAGNOSIS — Z6836 Body mass index (BMI) 36.0-36.9, adult: Secondary | ICD-10-CM | POA: Diagnosis not present

## 2021-12-09 DIAGNOSIS — M542 Cervicalgia: Secondary | ICD-10-CM | POA: Diagnosis not present

## 2021-12-09 DIAGNOSIS — E1165 Type 2 diabetes mellitus with hyperglycemia: Secondary | ICD-10-CM | POA: Diagnosis not present

## 2022-01-25 ENCOUNTER — Other Ambulatory Visit (HOSPITAL_COMMUNITY): Payer: Self-pay | Admitting: Neurology

## 2022-01-25 ENCOUNTER — Other Ambulatory Visit: Payer: Self-pay | Admitting: Neurology

## 2022-01-25 DIAGNOSIS — G8321 Monoplegia of upper limb affecting right dominant side: Secondary | ICD-10-CM

## 2022-01-25 DIAGNOSIS — G602 Neuropathy in association with hereditary ataxia: Secondary | ICD-10-CM | POA: Diagnosis not present

## 2022-01-25 DIAGNOSIS — M542 Cervicalgia: Secondary | ICD-10-CM | POA: Diagnosis not present

## 2022-01-25 DIAGNOSIS — Z79899 Other long term (current) drug therapy: Secondary | ICD-10-CM | POA: Diagnosis not present

## 2022-01-25 DIAGNOSIS — G8322 Monoplegia of upper limb affecting left dominant side: Secondary | ICD-10-CM | POA: Diagnosis not present

## 2022-01-25 DIAGNOSIS — M059 Rheumatoid arthritis with rheumatoid factor, unspecified: Secondary | ICD-10-CM | POA: Diagnosis not present

## 2022-01-25 DIAGNOSIS — E1142 Type 2 diabetes mellitus with diabetic polyneuropathy: Secondary | ICD-10-CM | POA: Diagnosis not present

## 2022-01-25 DIAGNOSIS — G5603 Carpal tunnel syndrome, bilateral upper limbs: Secondary | ICD-10-CM | POA: Diagnosis not present

## 2022-01-25 DIAGNOSIS — E559 Vitamin D deficiency, unspecified: Secondary | ICD-10-CM | POA: Diagnosis not present

## 2022-02-11 ENCOUNTER — Ambulatory Visit (HOSPITAL_COMMUNITY)
Admission: RE | Admit: 2022-02-11 | Discharge: 2022-02-11 | Disposition: A | Discharge status: Home / Self Care | Payer: 59 | Source: Ambulatory Visit | Attending: Neurology | Admitting: Neurology

## 2022-02-11 DIAGNOSIS — R519 Headache, unspecified: Secondary | ICD-10-CM | POA: Diagnosis not present

## 2022-02-11 DIAGNOSIS — G8321 Monoplegia of upper limb affecting right dominant side: Secondary | ICD-10-CM

## 2022-02-11 DIAGNOSIS — R200 Anesthesia of skin: Secondary | ICD-10-CM | POA: Diagnosis not present

## 2022-02-11 DIAGNOSIS — G8322 Monoplegia of upper limb affecting left dominant side: Secondary | ICD-10-CM | POA: Insufficient documentation
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
## 2022-02-21 DIAGNOSIS — M4722 Other spondylosis with radiculopathy, cervical region: Secondary | ICD-10-CM | POA: Diagnosis not present

## 2022-02-21 DIAGNOSIS — M059 Rheumatoid arthritis with rheumatoid factor, unspecified: Secondary | ICD-10-CM | POA: Diagnosis not present

## 2022-02-21 DIAGNOSIS — E1142 Type 2 diabetes mellitus with diabetic polyneuropathy: Secondary | ICD-10-CM | POA: Diagnosis not present

## 2022-02-21 DIAGNOSIS — G5603 Carpal tunnel syndrome, bilateral upper limbs: Secondary | ICD-10-CM | POA: Diagnosis not present

## 2022-02-21 DIAGNOSIS — M542 Cervicalgia: Secondary | ICD-10-CM | POA: Diagnosis not present

## 2022-02-23 ENCOUNTER — Other Ambulatory Visit: Payer: Self-pay | Admitting: Internal Medicine

## 2022-02-23 DIAGNOSIS — M7989 Other specified soft tissue disorders: Secondary | ICD-10-CM

## 2022-02-24 DIAGNOSIS — M0579 Rheumatoid arthritis with rheumatoid factor of multiple sites without organ or systems involvement: Secondary | ICD-10-CM | POA: Diagnosis not present

## 2022-03-02 ENCOUNTER — Ambulatory Visit (HOSPITAL_COMMUNITY)
Admission: RE | Admit: 2022-03-02 | Discharge: 2022-03-02 | Disposition: A | Discharge status: Home / Self Care | Payer: 59 | Source: Ambulatory Visit | Attending: Internal Medicine | Admitting: Internal Medicine

## 2022-03-02 DIAGNOSIS — M7989 Other specified soft tissue disorders: Secondary | ICD-10-CM | POA: Insufficient documentation

## 2022-03-02 DIAGNOSIS — M79602 Pain in left arm: Secondary | ICD-10-CM | POA: Diagnosis not present

## 2022-03-02 IMAGING — US US EXTREM  UP VENOUS*L*
1 series · 13 of 24 positions shown · non-contrast
Comparison: None Available.

CLINICAL DATA: Left upper extremity swelling, pain



[Series 1: us venous img upper uni left (dvt) · portal-venous · 13 of 43 slices shown]
[im 1/43]
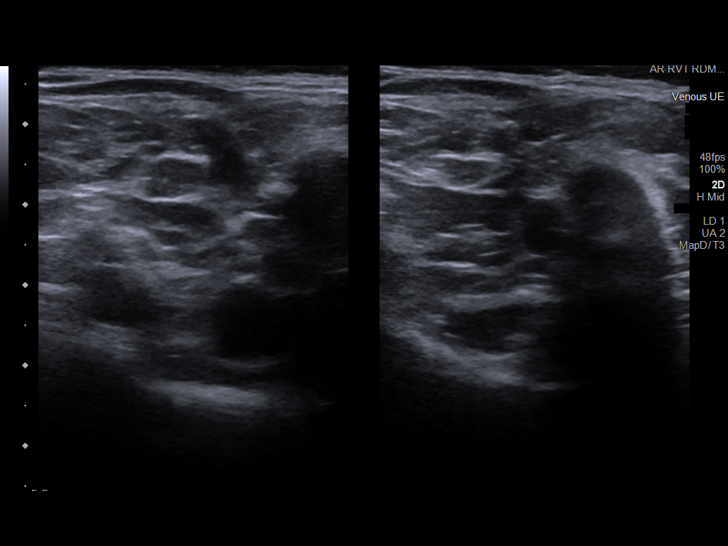
[im 4/43]
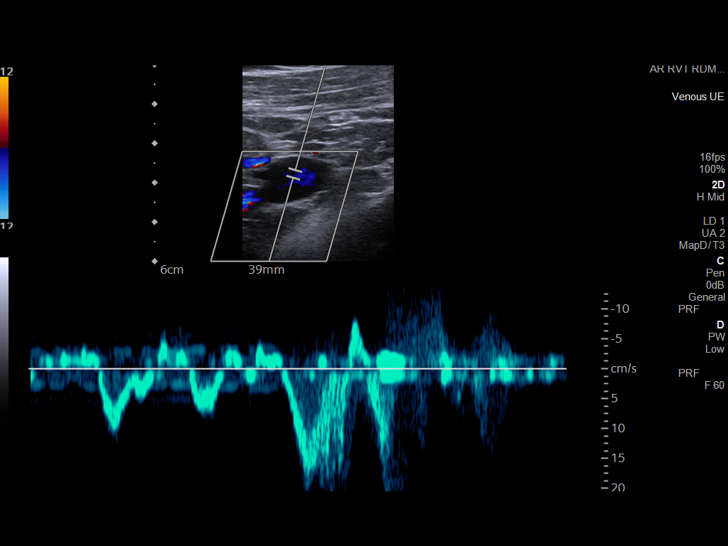
[im 8/43]
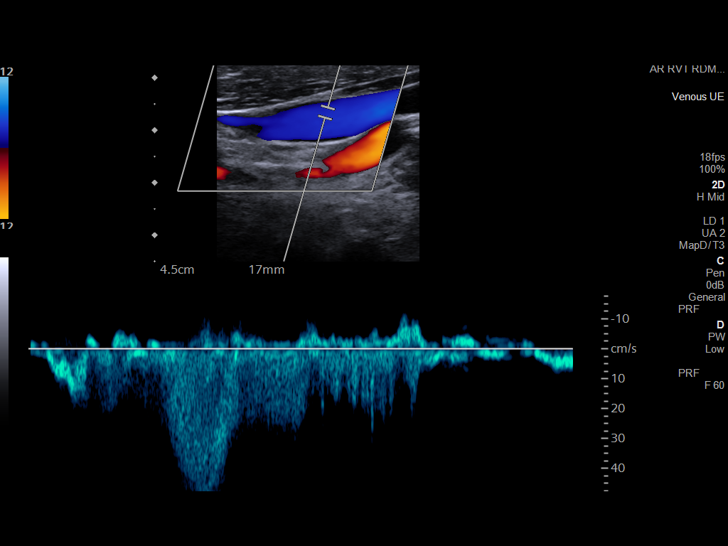
[im 11/43]
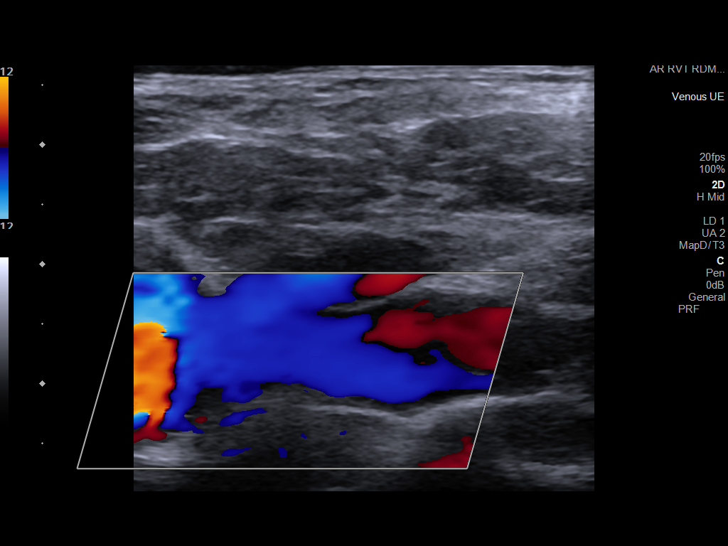
[im 15/43]
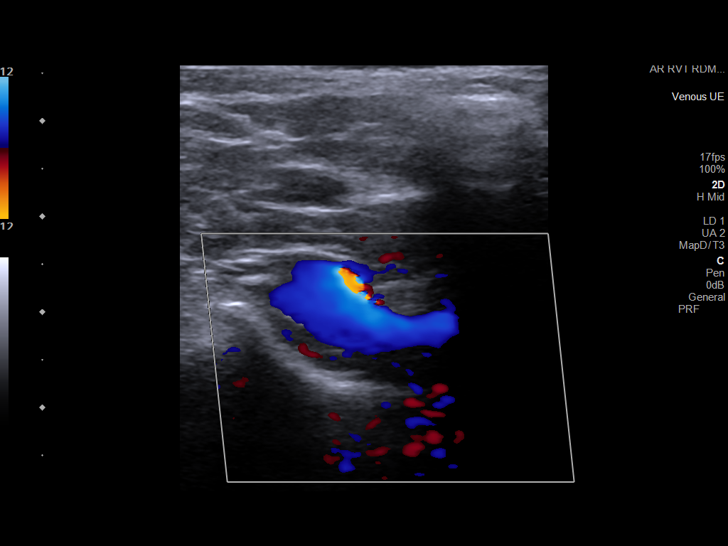
[im 19/43]
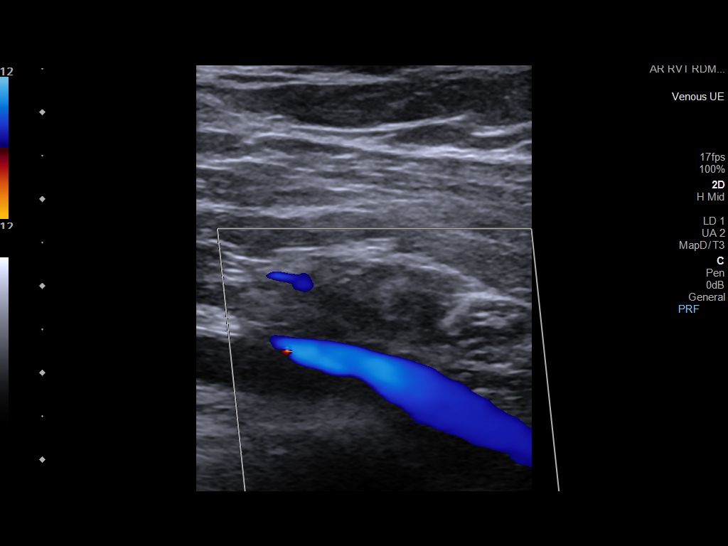
[im 22/43]
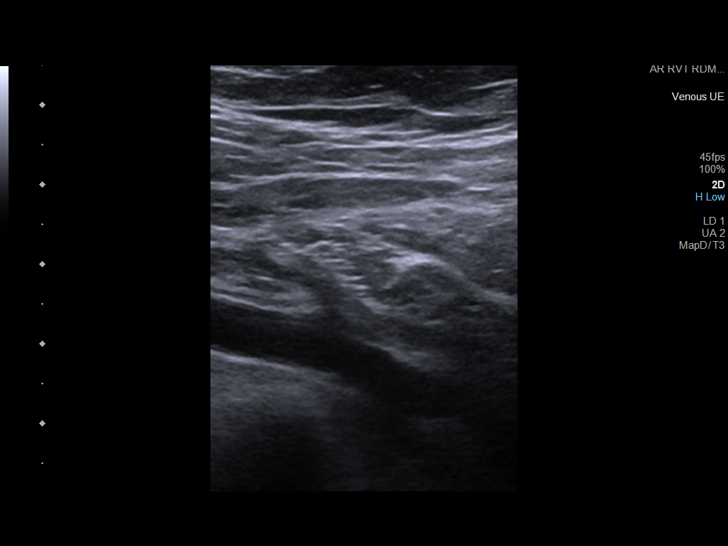
[im 24/43]
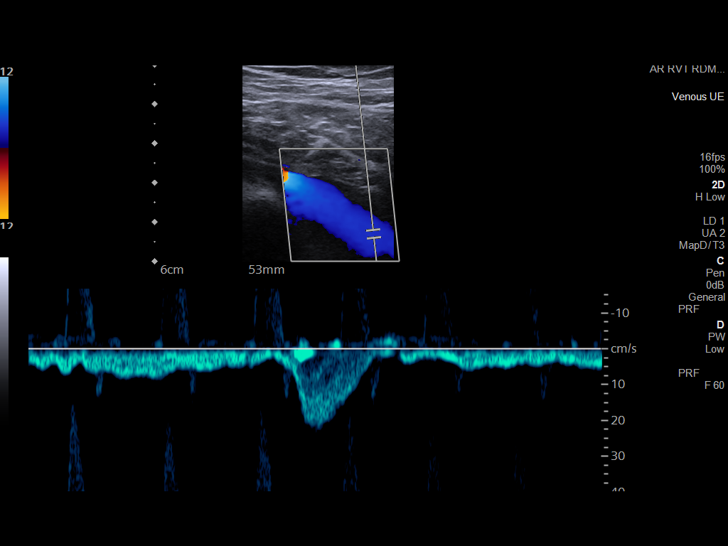
[im 28/43]
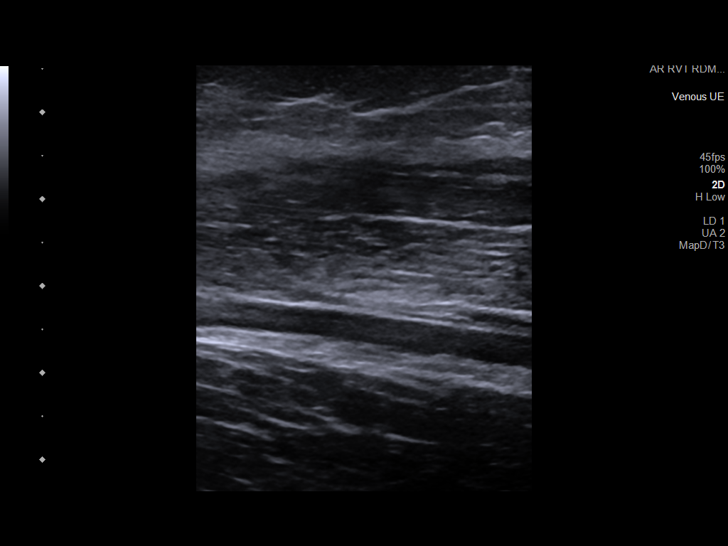
[im 32/43]
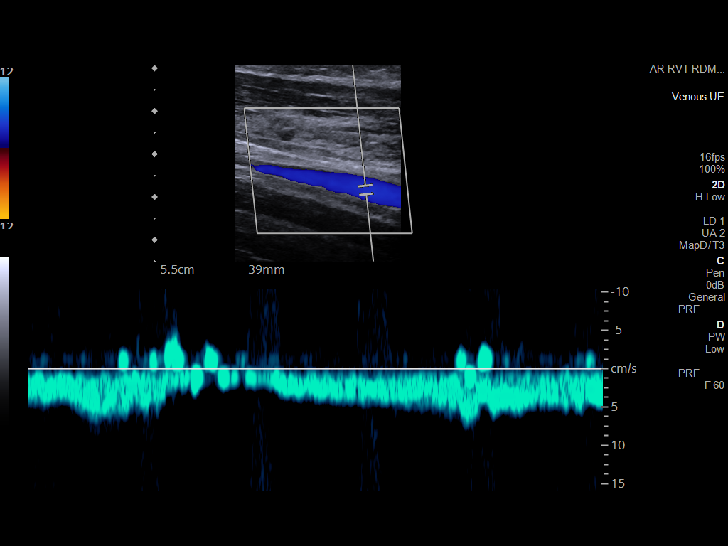
[im 35/43]
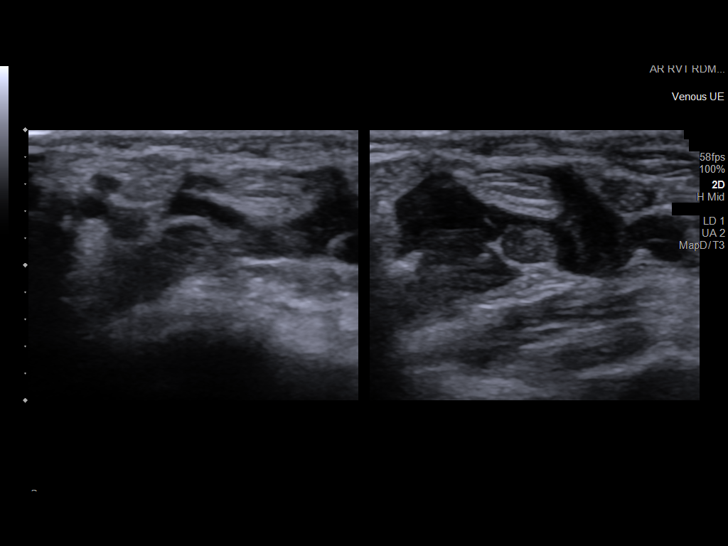
[im 39/43]
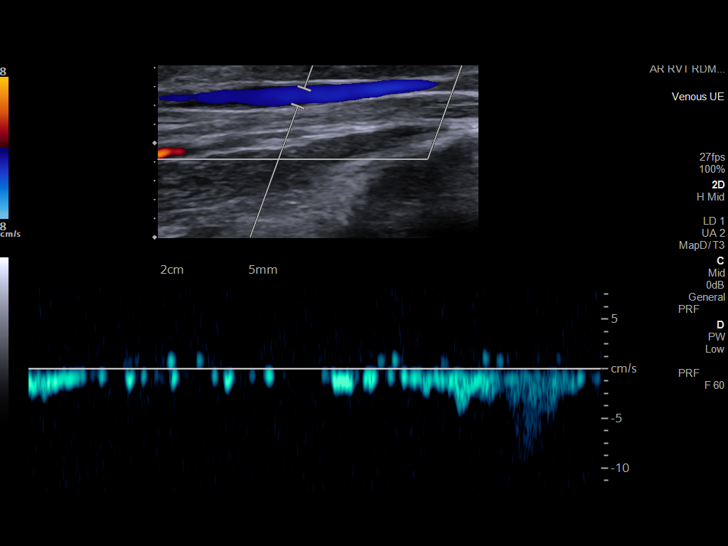
[im 43/43]
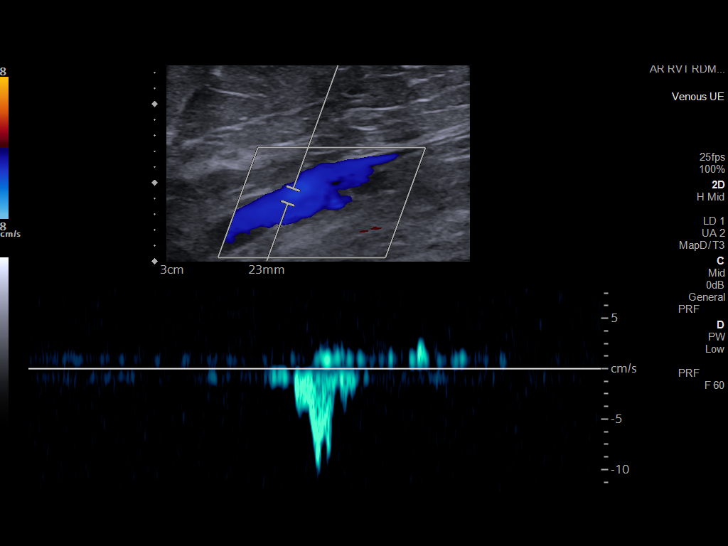

[13 of 24 positions shown; findings below may reference images not displayed]

FINDINGS: Contralateral Subclavian Vein: Respiratory phasicity is normal and
symmetric with the symptomatic side. No evidence of thrombus. Normal
compressibility.

Internal Jugular Vein: No evidence of thrombus. Normal
compressibility, respiratory phasicity and response to augmentation.

Subclavian Vein: No evidence of thrombus. Normal compressibility,
respiratory phasicity and response to augmentation.

Axillary Vein: No evidence of thrombus. Normal compressibility,
respiratory phasicity and response to augmentation.

Cephalic Vein: No evidence of thrombus. Normal compressibility,
respiratory phasicity and response to augmentation.

Basilic Vein: No evidence of thrombus. Normal compressibility,
respiratory phasicity and response to augmentation.

Brachial Veins: No evidence of thrombus. Normal compressibility,
respiratory phasicity and response to augmentation.

Radial Veins: No evidence of thrombus. Normal compressibility,
respiratory phasicity and response to augmentation.

Ulnar Veins: No evidence of thrombus. Normal compressibility,
respiratory phasicity and response to augmentation.
IMPRESSION: No evidence of DVT within the left upper extremity.

## 2022-03-03 DIAGNOSIS — H1032 Unspecified acute conjunctivitis, left eye: Secondary | ICD-10-CM | POA: Diagnosis not present

## 2022-03-03 DIAGNOSIS — M4722 Other spondylosis with radiculopathy, cervical region: Secondary | ICD-10-CM | POA: Diagnosis not present

## 2022-03-03 DIAGNOSIS — E1142 Type 2 diabetes mellitus with diabetic polyneuropathy: Secondary | ICD-10-CM | POA: Diagnosis not present

## 2022-03-04 DIAGNOSIS — M797 Fibromyalgia: Secondary | ICD-10-CM | POA: Diagnosis not present

## 2022-03-04 DIAGNOSIS — Z79899 Other long term (current) drug therapy: Secondary | ICD-10-CM | POA: Diagnosis not present

## 2022-03-04 DIAGNOSIS — M79643 Pain in unspecified hand: Secondary | ICD-10-CM | POA: Diagnosis not present

## 2022-03-04 DIAGNOSIS — Z13820 Encounter for screening for osteoporosis: Secondary | ICD-10-CM | POA: Diagnosis not present

## 2022-03-04 DIAGNOSIS — G5600 Carpal tunnel syndrome, unspecified upper limb: Secondary | ICD-10-CM | POA: Diagnosis not present

## 2022-03-04 DIAGNOSIS — G8929 Other chronic pain: Secondary | ICD-10-CM | POA: Diagnosis not present

## 2022-03-04 DIAGNOSIS — M0579 Rheumatoid arthritis with rheumatoid factor of multiple sites without organ or systems involvement: Secondary | ICD-10-CM | POA: Diagnosis not present

## 2022-03-04 DIAGNOSIS — M2550 Pain in unspecified joint: Secondary | ICD-10-CM | POA: Diagnosis not present

## 2022-03-04 DIAGNOSIS — M1990 Unspecified osteoarthritis, unspecified site: Secondary | ICD-10-CM | POA: Diagnosis not present

## 2022-03-04 DIAGNOSIS — M25569 Pain in unspecified knee: Secondary | ICD-10-CM | POA: Diagnosis not present

## 2022-03-04 DIAGNOSIS — M5412 Radiculopathy, cervical region: Secondary | ICD-10-CM | POA: Diagnosis not present

## 2022-03-08 DIAGNOSIS — M5412 Radiculopathy, cervical region: Secondary | ICD-10-CM | POA: Diagnosis not present

## 2022-03-08 DIAGNOSIS — M542 Cervicalgia: Secondary | ICD-10-CM | POA: Diagnosis not present

## 2022-03-23 DIAGNOSIS — I10 Essential (primary) hypertension: Secondary | ICD-10-CM | POA: Diagnosis not present

## 2022-03-23 DIAGNOSIS — E1169 Type 2 diabetes mellitus with other specified complication: Secondary | ICD-10-CM | POA: Diagnosis not present

## 2022-03-23 DIAGNOSIS — M542 Cervicalgia: Secondary | ICD-10-CM | POA: Diagnosis not present

## 2022-03-23 DIAGNOSIS — Z6835 Body mass index (BMI) 35.0-35.9, adult: Secondary | ICD-10-CM | POA: Diagnosis not present

## 2022-03-25 DIAGNOSIS — Z01818 Encounter for other preprocedural examination: Secondary | ICD-10-CM | POA: Diagnosis not present

## 2022-03-25 DIAGNOSIS — M5412 Radiculopathy, cervical region: Secondary | ICD-10-CM | POA: Diagnosis not present

## 2022-03-29 DIAGNOSIS — M4722 Other spondylosis with radiculopathy, cervical region: Secondary | ICD-10-CM | POA: Diagnosis not present

## 2022-03-29 DIAGNOSIS — M059 Rheumatoid arthritis with rheumatoid factor, unspecified: Secondary | ICD-10-CM | POA: Diagnosis not present

## 2022-03-29 DIAGNOSIS — E1142 Type 2 diabetes mellitus with diabetic polyneuropathy: Secondary | ICD-10-CM | POA: Diagnosis not present

## 2022-03-29 DIAGNOSIS — G5603 Carpal tunnel syndrome, bilateral upper limbs: Secondary | ICD-10-CM | POA: Diagnosis not present

## 2022-04-13 DIAGNOSIS — M5001 Cervical disc disorder with myelopathy,  high cervical region: Secondary | ICD-10-CM | POA: Diagnosis not present

## 2022-04-13 DIAGNOSIS — M47812 Spondylosis without myelopathy or radiculopathy, cervical region: Secondary | ICD-10-CM | POA: Diagnosis not present

## 2022-04-13 DIAGNOSIS — E119 Type 2 diabetes mellitus without complications: Secondary | ICD-10-CM | POA: Diagnosis not present

## 2022-04-13 DIAGNOSIS — M4802 Spinal stenosis, cervical region: Secondary | ICD-10-CM | POA: Diagnosis not present

## 2022-04-13 DIAGNOSIS — M2578 Osteophyte, vertebrae: Secondary | ICD-10-CM | POA: Diagnosis not present

## 2022-04-13 DIAGNOSIS — M4712 Other spondylosis with myelopathy, cervical region: Secondary | ICD-10-CM | POA: Diagnosis not present

## 2022-05-17 DIAGNOSIS — M542 Cervicalgia: Secondary | ICD-10-CM | POA: Diagnosis not present

## 2022-06-08 DIAGNOSIS — G5600 Carpal tunnel syndrome, unspecified upper limb: Secondary | ICD-10-CM | POA: Diagnosis not present

## 2022-06-08 DIAGNOSIS — M2550 Pain in unspecified joint: Secondary | ICD-10-CM | POA: Diagnosis not present

## 2022-06-08 DIAGNOSIS — M1990 Unspecified osteoarthritis, unspecified site: Secondary | ICD-10-CM | POA: Diagnosis not present

## 2022-06-08 DIAGNOSIS — M5412 Radiculopathy, cervical region: Secondary | ICD-10-CM | POA: Diagnosis not present

## 2022-06-08 DIAGNOSIS — Z79899 Other long term (current) drug therapy: Secondary | ICD-10-CM | POA: Diagnosis not present

## 2022-06-08 DIAGNOSIS — M79643 Pain in unspecified hand: Secondary | ICD-10-CM | POA: Diagnosis not present

## 2022-06-08 DIAGNOSIS — M0579 Rheumatoid arthritis with rheumatoid factor of multiple sites without organ or systems involvement: Secondary | ICD-10-CM | POA: Diagnosis not present

## 2022-06-08 DIAGNOSIS — M25569 Pain in unspecified knee: Secondary | ICD-10-CM | POA: Diagnosis not present

## 2022-06-08 DIAGNOSIS — M797 Fibromyalgia: Secondary | ICD-10-CM | POA: Diagnosis not present

## 2022-06-08 DIAGNOSIS — G8929 Other chronic pain: Secondary | ICD-10-CM | POA: Diagnosis not present

## 2022-06-08 DIAGNOSIS — Z13820 Encounter for screening for osteoporosis: Secondary | ICD-10-CM | POA: Diagnosis not present

## 2022-06-20 DIAGNOSIS — E1142 Type 2 diabetes mellitus with diabetic polyneuropathy: Secondary | ICD-10-CM | POA: Diagnosis not present

## 2022-06-20 DIAGNOSIS — M4722 Other spondylosis with radiculopathy, cervical region: Secondary | ICD-10-CM | POA: Diagnosis not present

## 2022-06-20 DIAGNOSIS — G5603 Carpal tunnel syndrome, bilateral upper limbs: Secondary | ICD-10-CM | POA: Diagnosis not present

## 2022-06-20 DIAGNOSIS — M059 Rheumatoid arthritis with rheumatoid factor, unspecified: Secondary | ICD-10-CM | POA: Diagnosis not present

## 2022-06-30 DIAGNOSIS — Z1159 Encounter for screening for other viral diseases: Secondary | ICD-10-CM | POA: Diagnosis not present

## 2022-06-30 DIAGNOSIS — E1169 Type 2 diabetes mellitus with other specified complication: Secondary | ICD-10-CM | POA: Diagnosis not present

## 2022-06-30 DIAGNOSIS — Z6834 Body mass index (BMI) 34.0-34.9, adult: Secondary | ICD-10-CM | POA: Diagnosis not present

## 2022-06-30 DIAGNOSIS — M542 Cervicalgia: Secondary | ICD-10-CM | POA: Diagnosis not present

## 2022-06-30 DIAGNOSIS — I10 Essential (primary) hypertension: Secondary | ICD-10-CM | POA: Diagnosis not present

## 2022-07-05 DIAGNOSIS — Z6834 Body mass index (BMI) 34.0-34.9, adult: Secondary | ICD-10-CM | POA: Diagnosis not present

## 2022-07-05 DIAGNOSIS — M542 Cervicalgia: Secondary | ICD-10-CM | POA: Diagnosis not present

## 2022-09-07 DIAGNOSIS — M4722 Other spondylosis with radiculopathy, cervical region: Secondary | ICD-10-CM | POA: Diagnosis not present

## 2022-09-07 DIAGNOSIS — E1142 Type 2 diabetes mellitus with diabetic polyneuropathy: Secondary | ICD-10-CM | POA: Diagnosis not present

## 2022-09-07 DIAGNOSIS — G5603 Carpal tunnel syndrome, bilateral upper limbs: Secondary | ICD-10-CM | POA: Diagnosis not present

## 2022-09-07 DIAGNOSIS — M059 Rheumatoid arthritis with rheumatoid factor, unspecified: Secondary | ICD-10-CM | POA: Diagnosis not present

## 2022-09-08 DIAGNOSIS — G8929 Other chronic pain: Secondary | ICD-10-CM | POA: Diagnosis not present

## 2022-09-08 DIAGNOSIS — Z13820 Encounter for screening for osteoporosis: Secondary | ICD-10-CM | POA: Diagnosis not present

## 2022-09-08 DIAGNOSIS — G5600 Carpal tunnel syndrome, unspecified upper limb: Secondary | ICD-10-CM | POA: Diagnosis not present

## 2022-09-08 DIAGNOSIS — M25569 Pain in unspecified knee: Secondary | ICD-10-CM | POA: Diagnosis not present

## 2022-09-08 DIAGNOSIS — Z79899 Other long term (current) drug therapy: Secondary | ICD-10-CM | POA: Diagnosis not present

## 2022-09-08 DIAGNOSIS — M1990 Unspecified osteoarthritis, unspecified site: Secondary | ICD-10-CM | POA: Diagnosis not present

## 2022-09-08 DIAGNOSIS — M797 Fibromyalgia: Secondary | ICD-10-CM | POA: Diagnosis not present

## 2022-09-08 DIAGNOSIS — M2550 Pain in unspecified joint: Secondary | ICD-10-CM | POA: Diagnosis not present

## 2022-09-08 DIAGNOSIS — M79643 Pain in unspecified hand: Secondary | ICD-10-CM | POA: Diagnosis not present

## 2022-09-08 DIAGNOSIS — M0579 Rheumatoid arthritis with rheumatoid factor of multiple sites without organ or systems involvement: Secondary | ICD-10-CM | POA: Diagnosis not present

## 2022-09-17 DIAGNOSIS — Z888 Allergy status to other drugs, medicaments and biological substances status: Secondary | ICD-10-CM | POA: Diagnosis not present

## 2022-09-17 DIAGNOSIS — R609 Edema, unspecified: Secondary | ICD-10-CM | POA: Diagnosis not present

## 2022-09-17 DIAGNOSIS — K219 Gastro-esophageal reflux disease without esophagitis: Secondary | ICD-10-CM | POA: Diagnosis not present

## 2022-09-17 DIAGNOSIS — Z6835 Body mass index (BMI) 35.0-35.9, adult: Secondary | ICD-10-CM | POA: Diagnosis not present

## 2022-09-17 DIAGNOSIS — Z7984 Long term (current) use of oral hypoglycemic drugs: Secondary | ICD-10-CM | POA: Diagnosis not present

## 2022-09-17 DIAGNOSIS — Z8249 Family history of ischemic heart disease and other diseases of the circulatory system: Secondary | ICD-10-CM | POA: Diagnosis not present

## 2022-09-17 DIAGNOSIS — Z7952 Long term (current) use of systemic steroids: Secondary | ICD-10-CM | POA: Diagnosis not present

## 2022-09-17 DIAGNOSIS — Z87891 Personal history of nicotine dependence: Secondary | ICD-10-CM | POA: Diagnosis not present

## 2022-09-17 DIAGNOSIS — R69 Illness, unspecified: Secondary | ICD-10-CM | POA: Diagnosis not present

## 2022-09-17 DIAGNOSIS — I10 Essential (primary) hypertension: Secondary | ICD-10-CM | POA: Diagnosis not present

## 2022-09-17 DIAGNOSIS — E1140 Type 2 diabetes mellitus with diabetic neuropathy, unspecified: Secondary | ICD-10-CM | POA: Diagnosis not present

## 2022-09-17 DIAGNOSIS — M069 Rheumatoid arthritis, unspecified: Secondary | ICD-10-CM | POA: Diagnosis not present

## 2022-11-08 DIAGNOSIS — M542 Cervicalgia: Secondary | ICD-10-CM | POA: Diagnosis not present

## 2022-11-29 DIAGNOSIS — Z981 Arthrodesis status: Secondary | ICD-10-CM | POA: Diagnosis not present

## 2022-11-29 DIAGNOSIS — M4802 Spinal stenosis, cervical region: Secondary | ICD-10-CM | POA: Diagnosis not present

## 2023-02-16 ENCOUNTER — Ambulatory Visit (HOSPITAL_COMMUNITY): Payer: PRIVATE HEALTH INSURANCE

## 2023-04-07 ENCOUNTER — Other Ambulatory Visit: Payer: Self-pay | Admitting: Radiology

## 2023-04-07 DIAGNOSIS — I209 Angina pectoris, unspecified: Secondary | ICD-10-CM

## 2023-04-11 ENCOUNTER — Ambulatory Visit: Payer: PRIVATE HEALTH INSURANCE | Attending: Cardiovascular Disease

## 2023-04-11 DIAGNOSIS — I209 Angina pectoris, unspecified: Secondary | ICD-10-CM

## 2023-04-11 LAB — EXERCISE TOLERANCE TEST
Angina Index: 0
Duke Treadmill Score: 4
Estimated workload: 5.7
Exercise duration (min): 4 min
Exercise duration (sec): 0 s
MPHR: 157 {beats}/min
Peak HR: 146 {beats}/min
Percent HR: 92 %
RPE: 16
Rest HR: 66 {beats}/min
ST Depression (mm): 0 mm

## 2023-04-18 ENCOUNTER — Telehealth: Payer: Self-pay | Admitting: Radiology

## 2023-04-18 NOTE — Telephone Encounter (Signed)
Faxed on 6/18 @ 400

## 2023-04-18 NOTE — Telephone Encounter (Signed)
[facility] called to get the patient lab results sent to the them. Fax to [phone]

## 2023-07-06 DIAGNOSIS — M542 Cervicalgia: Secondary | ICD-10-CM | POA: Insufficient documentation

## 2023-07-06 DIAGNOSIS — M5412 Radiculopathy, cervical region: Secondary | ICD-10-CM | POA: Insufficient documentation

## 2023-07-06 DIAGNOSIS — R202 Paresthesia of skin: Secondary | ICD-10-CM | POA: Insufficient documentation

## 2023-09-04 NOTE — Telephone Encounter (Signed)
 Per Dr. Geneva office note from 08/31/[address] mailed [address] Ross Daniels.

## 2024-09-03 DIAGNOSIS — M899 Disorder of bone, unspecified: Secondary | ICD-10-CM | POA: Insufficient documentation

## 2024-09-03 DIAGNOSIS — Z79899 Other long term (current) drug therapy: Secondary | ICD-10-CM | POA: Insufficient documentation

## 2024-09-03 DIAGNOSIS — G894 Chronic pain syndrome: Secondary | ICD-10-CM | POA: Insufficient documentation

## 2024-09-03 DIAGNOSIS — Z789 Other specified health status: Secondary | ICD-10-CM | POA: Insufficient documentation

## 2024-09-03 DIAGNOSIS — R937 Abnormal findings on diagnostic imaging of other parts of musculoskeletal system: Secondary | ICD-10-CM | POA: Insufficient documentation

## 2024-09-03 NOTE — Patient Instructions (Signed)
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
## 2024-09-04 ENCOUNTER — Ambulatory Visit (HOSPITAL_BASED_OUTPATIENT_CLINIC_OR_DEPARTMENT_OTHER): Admitting: Pain Medicine

## 2024-09-04 DIAGNOSIS — Z91199 Patient's noncompliance with other medical treatment and regimen due to unspecified reason: Secondary | ICD-10-CM

## 2024-09-04 DIAGNOSIS — Z79899 Other long term (current) drug therapy: Secondary | ICD-10-CM

## 2024-09-04 DIAGNOSIS — G8929 Other chronic pain: Secondary | ICD-10-CM

## 2024-09-04 DIAGNOSIS — G894 Chronic pain syndrome: Secondary | ICD-10-CM

## 2024-09-04 DIAGNOSIS — M899 Disorder of bone, unspecified: Secondary | ICD-10-CM

## 2024-09-04 DIAGNOSIS — R937 Abnormal findings on diagnostic imaging of other parts of musculoskeletal system: Secondary | ICD-10-CM

## 2024-09-04 DIAGNOSIS — Z789 Other specified health status: Secondary | ICD-10-CM

## 2024-09-16 ENCOUNTER — Ambulatory Visit: Attending: Internal Medicine | Admitting: Physical Therapy

## 2024-09-16 ENCOUNTER — Encounter: Payer: Self-pay | Admitting: Physical Therapy

## 2024-09-16 DIAGNOSIS — M542 Cervicalgia: Secondary | ICD-10-CM | POA: Diagnosis present

## 2024-09-16 DIAGNOSIS — G8929 Other chronic pain: Secondary | ICD-10-CM | POA: Diagnosis present

## 2024-09-16 DIAGNOSIS — M25512 Pain in left shoulder: Secondary | ICD-10-CM | POA: Insufficient documentation

## 2024-09-16 DIAGNOSIS — M6281 Muscle weakness (generalized): Secondary | ICD-10-CM | POA: Diagnosis present

## 2024-09-16 DIAGNOSIS — M25511 Pain in right shoulder: Secondary | ICD-10-CM | POA: Insufficient documentation

## 2024-09-16 NOTE — Therapy (Signed)
 OUTPATIENT PHYSICAL THERAPY EVALUATION   Patient Name: Cynthia Dickson MRN: [id number] DOB:Jun 10, 1961, 63 y.o., female Today's Date: 09/16/2024  END OF SESSION:  PT End of Session - 09/16/24 1213     Visit Number 1    Number of Visits 10    Date for Recertification  10/28/24    PT Start Time 1100    PT Stop Time 1145    PT Time Calculation (min) 45 min    Activity Tolerance Patient tolerated treatment well    Behavior During Therapy [facility] for tasks assessed/performed          Past Medical History:  Diagnosis Date   GERD (gastroesophageal reflux disease)    Hypertension    Mild asthma    no inhaler   Numbness and tingling of both upper extremities    09-19-2018 per pt when wakes up in morning since had bilateral rotator cuff repairs,  then goes away after working it out   OSA (obstructive sleep apnea)    Past Surgical History:  Procedure Laterality Date   ABDOMINAL HYSTERECTOMY  2017   COLONOSCOPY     KNEE ARTHROSCOPY Right 1996   LAPAROSCOPIC CHOLECYSTECTOMY  2000   ROTATOR CUFF REPAIR Bilateral right 2017;  left 1996   TOTAL KNEE ARTHROPLASTY Left 09/18/2017   Procedure: LEFT TOTAL KNEE ARTHROPLASTY;  Surgeon: Melodi Lerner, MD;  Location: [facility];  Service: Orthopedics;  Laterality: Left;   TOTAL KNEE ARTHROPLASTY Right 09/24/2018   Procedure: RIGHT TOTAL KNEE ARTHROPLASTY;  Surgeon: Melodi Lerner, MD;  Location: [facility];  Service: Orthopedics;  Laterality: Right;  Adductor Block   Patient Active Problem List   Diagnosis Date Noted   Chronic pain syndrome 09/03/2024   Pharmacologic therapy 09/03/2024   Disorder of skeletal system 09/03/2024   Problems influencing health status 09/03/2024   Abnormal MRI, cervical spine (02/11/2022) 09/03/2024   Cervical radiculopathy 07/06/2023   Hand paresthesia 07/06/2023   Neck pain 07/06/2023   Pes anserinus bursitis of right knee 06/25/2021   History of total knee replacement, right 10/09/2018   Aftercare 03/23/2018    History of total knee replacement, left 12/15/2017   Osteoarthritis of knee 09/18/2017   Biceps tendinitis of right upper extremity 12/27/2016    ERE:Yjddjw, Cinderella Seltzer, MD   REFERRING PROVIDER: Orpha Yancey LABOR, MD   REFERRING DIAG: M54.2 (ICD-10-CM) - Cervicalgia   Rationale for Evaluation and Treatment:  Rehabiliation  THERAPY DIAG:  Cervicalgia  Chronic right shoulder pain  Chronic left shoulder pain  Muscle weakness (generalized)  ONSET DATE: 2023, neck pain and surgery   SUBJECTIVE:  SUBJECTIVE STATEMENT: She says her neck and shoulders hurt and have always hurt since surgery in 2023. She says the pain is always there and constant. She has difficulty with any activity using her arms. She also reports previous RTC surgery as well as bilat knee replacements. She does complaint on N/T down her right arms.   PERTINENT HISTORY:  See above PMH  PAIN:  NPRS scale: 9/10 upon arrival Pain location:neck and both shoudlers Pain description: constant, achy, sharp, numb Aggravating factors: moving, turning head, lifting Relieving factors: meds, has home TENS   PRECAUTIONS: ,  Cervical fusion, bilat RTC repairs  RED FLAGS: None   WEIGHT BEARING RESTRICTIONS:  No  FALLS:  Has patient fallen in last 6 months? Yes. Number of falls 1, slipped, she does feel she may lose her balance sometimes bending over  PLOF:  Independent with basic ADLs, does have to get help with reaching, lifting, opening jars, help with bra  PATIENT GOALS:  Help the pain some  OBJECTIVE:  Note: Objective measures were completed at Evaluation unless otherwise noted.  DIAGNOSTIC FINDINGS:  CT 2024  Impression  1. C3-C6 ACDF without definite bony fusion. No evidence of hardware fracture or loosening. 2. No  evidence of a acute fracture or malalignment. 3. Multilevel degenerative change including facet/uncovertebral hypertrophy with varying degrees of neural foraminal stenosis.  PATIENT SURVEYS:  Patient-Specific Activity Scoring Scheme  0 represents "unable to perform." 10 represents "able to perform at prior level. 0 1 2 3 4 5 6 7 8 9  10 (Date and Score)   Activity Eval     1. Turning head 5    2.       3.     4.    5.    Score 5/10    Total score = sum of the activity scores/number of activities Minimum detectable change (90%CI) for average score = 2 points Minimum detectable change (90%CI) for single activity score = 3 points  CERVICAL ROM:   Active ROM A/PROM (deg) eval  Flexion 40  Extension 20  Right lateral flexion 20  Left lateral flexion 20  Right rotation 35  Left rotation 40   (Blank rows = not tested)   UPPER EXTREMITY ROM:  Active ROM Right eval Left eval  Shoulder flexion 90 90  Shoulder extension    Shoulder abduction 70 70  Shoulder adduction    Shoulder extension    Shoulder internal rotation    Shoulder external rotation    Elbow flexion    Elbow extension    Wrist flexion    Wrist extension    Wrist ulnar deviation    Wrist radial deviation    Wrist pronation    Wrist supination     (Blank rows = not tested)   UPPER EXTREMITY MMT:  MMT Right eval Left eval  Shoulder flexion 2 2  Shoulder extension    Shoulder abduction    Shoulder adduction 2 2  Shoulder extension    Shoulder internal rotation 2 2  Shoulder external rotation 2 2  Middle trapezius    Lower trapezius    Elbow flexion 3 3  Elbow extension 3 3  Wrist flexion    Wrist extension    Wrist ulnar deviation    Wrist radial deviation    Wrist pronation    Wrist supination    Grip strength     (Blank rows = not tested)  TREATMENT DATE:   Eval HEP creation and review with demonstration and trial set preformed, see below for details Moist heat X 10 min to neck during this Selfcare: Discussed benefit of heat pack at home to help with the tightness and stiffness, discussed need to try to improve sleep quality and impact on chronic pain and inflammation, discussed home TENS unit (she has one and I recommended she bring it to next session)    PATIENT EDUCATION: Education details: HEP, PT plan of care, selfcare Person educated: Patient Education method: Explanation, Demonstration, Verbal cues, and Handouts Education comprehension: verbalized understanding, further education recommended   HOME EXERCISE PROGRAM: Access Code: [email or web address] URL: [email or web address] Date: 09/16/2024 Prepared by: Redell Moose  Exercises - Seated Scapular Retraction with External Rotation  - 2 x daily - 6 x weekly - 3 sets - 10 reps - Seated Passive Cervical Retraction  - 2 x daily - 6 x weekly - 3 sets - 10 reps - Seated Gentle Upper Trapezius Stretch  - 2 x daily - 6 x weekly - 1 sets - 10 reps - 5 sec hold - Gentle Levator Scapulae Stretch (Mirrored)  - 2 x daily - 6 x weekly - 1 sets - 5 reps - 10 sec hold - Seated Assisted Cervical Rotation with Towel  - 2 x daily - 6 x weekly - 1 sets - 10 reps - 5 sec hold  ASSESSMENT:  CLINICAL IMPRESSION: Patient referred to PT for neck pain and pain in both shoulders. She has some signs of symptoms of right sided cervical radiculopathy with complaints on N/T down her arm. She does have history of C3-C6 ACDF as well as blat shoulder RTC repairs in past. Patient will benefit from skilled PT to improve overall function and to address impairments and limitations listed below.  OBJECTIVE IMPAIRMENTS: decreased activity tolerance for ADL's, difficulty walking, decreased balance, decreased endurance, decreased mobility, decreased ROM, decreased strength, impaired flexibility, impaired UE use, and  pain.  ACTIVITY LIMITATIONS: bending, liftting, walking, standing, cleaning, community activity, driving, reaching, carry  PERSONAL FACTORS: see above PMH  also affecting patient's functional outcome.  REHAB POTENTIAL: Fair    CLINICAL DECISION MAKING: Evolving/moderate complexity  EVALUATION COMPLEXITY: Moderate    GOALS: Short term PT Goals Target date: 10/14/2024   Pt will be I and compliant with HEP. Baseline:  Goal status: New Pt will decrease pain by 25% overall Baseline: Goal status: New  Long term PT goals Target date:10/28/2024   Pt will improve neck AROM to [facility] to improve functional mobility Baseline: Goal status: New Pt will improve  strength to at least 4/5 MMT to improve functional strength Baseline: Goal status: New Pt will improve Patient specific functional scale (PSFS) to at least 7/10 to show improved function level Baseline: Goal status: New Pt will reduce pain to overall less than 3/10 with usual activity  Baseline: Goal status: New Pt will improve bilat shoudler flexion AROM to >100 degrees to improve reaching Baseline: Goal status: New  PLAN: PT FREQUENCY: 1-2 times per week   PT DURATION: 4-6 weeks  PLANNED INTERVENTIONS  97110-Therapeutic exercises, 97530- Therapeutic activity, [id number]- Neuromuscular re-education, 97535- Self Care, [id number]- Manual therapy, [id number]- Electrical stimulation (unattended), and Patient/Family education  PLAN FOR NEXT SESSION: previous cervical fusion C3-C6. NEXT MD VISIT:   Redell JONELLE Moose, PT,DPT 09/16/2024, 12:14 PM

## 2024-09-19 ENCOUNTER — Ambulatory Visit (HOSPITAL_COMMUNITY)

## 2024-09-23 ENCOUNTER — Encounter: Payer: Self-pay | Admitting: Physical Therapy

## 2024-09-23 ENCOUNTER — Ambulatory Visit: Admitting: Physical Therapy

## 2024-09-23 NOTE — Therapy (Signed)
 Patient arrived but only wanted to work with evaluating therapist, requested that we reschedule this visit. Clinic manager aware and worked with pt to adjust schedule. No charge for today.   Josette Rough, PT, DPT 09/23/24 9:36 AM

## 2024-09-24 ENCOUNTER — Ambulatory Visit: Admitting: Physical Therapy

## 2024-10-09 ENCOUNTER — Ambulatory Visit: Admitting: Physical Therapy

## 2024-10-11 ENCOUNTER — Ambulatory Visit: Attending: Internal Medicine | Admitting: Physical Therapy

## 2024-10-16 ENCOUNTER — Ambulatory Visit: Admitting: Physical Therapy

## 2024-10-22 ENCOUNTER — Ambulatory Visit: Admitting: Physical Therapy

## 2024-11-07 ENCOUNTER — Ambulatory Visit: Attending: Internal Medicine | Admitting: Physical Therapy

## 2024-11-29 ENCOUNTER — Ambulatory Visit: Admitting: Physical Therapy

## 2024-12-05 ENCOUNTER — Ambulatory Visit: Admitting: Physical Therapy

## 2024-12-09 ENCOUNTER — Ambulatory Visit: Attending: Internal Medicine | Admitting: Physical Therapy
